# Patient Record
Sex: Female | Born: 1960
Health system: Southern US, Community
[De-identification: ages and names within clinical notes are randomized; demographics above are authoritative.]

## PROBLEM LIST (undated history)

## (undated) DIAGNOSIS — I10 Essential (primary) hypertension: Secondary | ICD-10-CM

## (undated) DIAGNOSIS — E7800 Pure hypercholesterolemia, unspecified: Secondary | ICD-10-CM

## (undated) HISTORY — DX: Pure hypercholesterolemia, unspecified: E78.00

## (undated) HISTORY — DX: Essential (primary) hypertension: I10

## (undated) HISTORY — PX: BREAST SURGERY: SHX581

---

## 2000-09-03 HISTORY — PX: AUGMENTATION MAMMAPLASTY: SUR837

## 2016-03-15 ENCOUNTER — Emergency Department (HOSPITAL_COMMUNITY): Payer: BLUE CROSS/BLUE SHIELD

## 2016-03-15 ENCOUNTER — Encounter (HOSPITAL_COMMUNITY): Payer: Self-pay | Admitting: Emergency Medicine

## 2016-03-15 ENCOUNTER — Emergency Department (HOSPITAL_COMMUNITY)
Admission: EM | Admit: 2016-03-15 | Discharge: 2016-03-16 | Disposition: A | Discharge status: Home / Self Care | Payer: BLUE CROSS/BLUE SHIELD | Attending: Emergency Medicine | Admitting: Emergency Medicine

## 2016-03-15 DIAGNOSIS — Z79899 Other long term (current) drug therapy: Secondary | ICD-10-CM | POA: Diagnosis not present

## 2016-03-15 DIAGNOSIS — N9489 Other specified conditions associated with female genital organs and menstrual cycle: Secondary | ICD-10-CM

## 2016-03-15 DIAGNOSIS — N739 Female pelvic inflammatory disease, unspecified: Secondary | ICD-10-CM | POA: Diagnosis not present

## 2016-03-15 DIAGNOSIS — N83201 Unspecified ovarian cyst, right side: Secondary | ICD-10-CM | POA: Diagnosis not present

## 2016-03-15 DIAGNOSIS — R935 Abnormal findings on diagnostic imaging of other abdominal regions, including retroperitoneum: Secondary | ICD-10-CM

## 2016-03-15 DIAGNOSIS — R1030 Lower abdominal pain, unspecified: Secondary | ICD-10-CM | POA: Diagnosis present

## 2016-03-15 LAB — COMPREHENSIVE METABOLIC PANEL
ALT: 24 U/L (ref 14–54)
AST: 25 U/L (ref 15–41)
Albumin: 4.3 g/dL (ref 3.5–5.0)
Alkaline Phosphatase: 88 U/L (ref 38–126)
Anion gap: 11 (ref 5–15)
BUN: 12 mg/dL (ref 6–20)
CO2: 20 mmol/L — ABNORMAL LOW (ref 22–32)
Calcium: 9.6 mg/dL (ref 8.9–10.3)
Chloride: 103 mmol/L (ref 101–111)
Creatinine, Ser: 0.85 mg/dL (ref 0.44–1.00)
GFR calc Af Amer: >60 mL/min (ref >60)
GFR calc non Af Amer: >60 mL/min (ref >60)
Glucose, Bld: 144 mg/dL — ABNORMAL HIGH (ref 65–99)
Potassium: 4.1 mmol/L (ref 3.5–5.1)
Sodium: 134 mmol/L — ABNORMAL LOW (ref 135–145)
Total Bilirubin: 1.1 mg/dL (ref 0.3–1.2)
Total Protein: 7.9 g/dL (ref 6.5–8.1)

## 2016-03-15 LAB — URINALYSIS, ROUTINE W REFLEX MICROSCOPIC
Bilirubin Urine: NEGATIVE
Glucose, UA: NEGATIVE mg/dL
Ketones, ur: NEGATIVE mg/dL
Nitrite: NEGATIVE
Protein, ur: NEGATIVE mg/dL
Specific Gravity, Urine: 1.019 (ref 1.005–1.030)
pH: 7.5 (ref 5.0–8.0)

## 2016-03-15 LAB — CBC
HCT: 39.5 % (ref 36.0–46.0)
Hemoglobin: 13.5 g/dL (ref 12.0–15.0)
MCH: 30.9 pg (ref 26.0–34.0)
MCHC: 34.2 g/dL (ref 30.0–36.0)
MCV: 90.4 fL (ref 78.0–100.0)
Platelets: 322 10*3/uL (ref 150–400)
RBC: 4.37 MIL/uL (ref 3.87–5.11)
RDW: 13.4 % (ref 11.5–15.5)
WBC: 16.6 10*3/uL — ABNORMAL HIGH (ref 4.0–10.5)

## 2016-03-15 LAB — URINE MICROSCOPIC-ADD ON

## 2016-03-15 LAB — LIPASE, BLOOD: Lipase: 17 U/L (ref 11–51)

## 2016-03-15 LAB — PREGNANCY, URINE: Preg Test, Ur: NEGATIVE

## 2016-03-15 MED ORDER — IOPAMIDOL (ISOVUE-300) INJECTION 61%
100.0000 mL | Freq: Once | INTRAVENOUS | Status: AC | PRN
Start: 2016-03-15 — End: 2016-03-15
  Administered 2016-03-15: 100 mL via INTRAVENOUS

## 2016-03-15 MED ORDER — SODIUM CHLORIDE 0.9 % IV BOLUS (SEPSIS)
1000.0000 mL | Freq: Once | INTRAVENOUS | Status: AC
  Administered 2016-03-15: 1000 mL via INTRAVENOUS

## 2016-03-15 MED ORDER — IOPAMIDOL (ISOVUE-300) INJECTION 61%: 100.0000 mL | Freq: Once | INTRAVENOUS | Status: DC | PRN

## 2016-03-15 MED ORDER — ONDANSETRON 4 MG PO TBDP
4.0000 mg | ORAL_TABLET | Freq: Once | ORAL | Status: AC | PRN
  Administered 2016-03-15: 4 mg via ORAL
  Filled 2016-03-15: qty 1

## 2016-03-15 MED ORDER — FENTANYL CITRATE (PF) 100 MCG/2ML IJ SOLN
50.0000 ug | Freq: Once | INTRAMUSCULAR | Status: AC
  Administered 2016-03-15: 50 ug via INTRAVENOUS
  Filled 2016-03-15: qty 2

## 2016-03-15 MED ORDER — MORPHINE SULFATE (PF) 2 MG/ML IV SOLN
2.0000 mg | Freq: Once | INTRAVENOUS | Status: AC
  Administered 2016-03-15: 2 mg via INTRAVENOUS
  Filled 2016-03-15: qty 1

## 2016-03-15 MED ORDER — MORPHINE SULFATE (PF) 4 MG/ML IV SOLN: 4.0000 mg | Freq: Once | INTRAVENOUS | Status: DC

## 2016-03-15 MED ORDER — ONDANSETRON HCL 4 MG/2ML IJ SOLN
4.0000 mg | Freq: Once | INTRAMUSCULAR | Status: AC
  Administered 2016-03-15: 4 mg via INTRAVENOUS
  Filled 2016-03-15: qty 2

## 2016-03-15 NOTE — ED Notes (Signed)
Patient presents for abdominal pain, nausea and fever at home of 101. Reports drinking mag citrate earlier with one soft BM. No urinary symptoms, emesis or diarrhea.

## 2016-03-15 NOTE — ED Provider Notes (Signed)
CSN: [id number]     Arrival date & time 03/15/16  1728 History   First MD Initiated Contact with Patient 03/15/16 2035     Chief Complaint  Patient presents with  . Abdominal Pain  . Diarrhea    HPI   Pat Patrickraci Minion is an 55 y.o. female with no significant PMH who presents to the ED for evaluation of abdominal pain, nausea, and diarrhea. She states her symptoms began this morning. She states she woke up with sharp, severe, diffuse abdominal cramping. She states her Tmax at home was 101F. She reports associated nausea but denies emesis. She states she was in [place] last week and had been taking immodium prophylactically all week so her stools have been hard. She thought she was constipated or "had a blockage" today so she drank a bottle of mag citrate at home this morning. She states now she has been having watery loose stools all throughout the day. She states people she went to [place] with are also having similar symptoms. Denies urinary symptoms or vaginal complaints. Denies chest pain or SOB.  History reviewed. No pertinent past medical history. Past Surgical History  Procedure Laterality Date  . Breast surgery     No family history on file. Social History  Substance Use Topics  . Smoking status: Never Smoker   . Smokeless tobacco: None  . Alcohol Use: Yes   OB History    No data available     Review of Systems  All other systems reviewed and are negative.     Allergies  Review of patient's allergies indicates no known allergies.  Home Medications   Prior to Admission medications   Medication Sig Start Date End Date Taking? Authorizing Provider  calcium carbonate (TUMS - DOSED IN MG ELEMENTAL CALCIUM) 500 MG chewable tablet Chew 1 tablet by mouth 2 (two) times daily with a meal.   Yes Historical Provider, MD  loperamide (IMODIUM A-D) 2 MG tablet Take 2 mg by mouth 4 (four) times daily as needed for diarrhea or loose stools.   Yes Historical Provider, MD  magnesium citrate  SOLN Take 1 Bottle by mouth once.   Yes Historical Provider, MD   BP 120/65 mmHg  Pulse 97  Temp(Src) 99.2 F (37.3 C) (Oral)  Resp 16  Ht 5\' 4"  (1.626 m)  Wt 81.647 kg  BMI 30.88 kg/m2  SpO2 96%  LMP 03/03/2016 Physical Exam  Constitutional: She is oriented to person, place, and time.  HENT:  Right Ear: External ear normal.  Left Ear: External ear normal.  Nose: Nose normal.  Mouth/Throat: Oropharynx is clear and moist. No oropharyngeal exudate.  MM dry  Eyes: Conjunctivae and EOM are normal. Pupils are equal, round, and reactive to light.  Neck: Normal range of motion. Neck supple.  Cardiovascular: Normal rate, regular rhythm, normal heart sounds and intact distal pulses.   Pulmonary/Chest: Effort normal and breath sounds normal. No respiratory distress. She has no wheezes.  Abdominal: Soft. Bowel sounds are normal. She exhibits no distension. There is tenderness. There is no rebound and no guarding.  Abdomen diffusely tender without rebound or guarding. Pain is worse with coughing. No CVA tenderness  Musculoskeletal: She exhibits no edema.  Neurological: She is alert and oriented to person, place, and time. No cranial nerve deficit.  Skin: Skin is warm and dry.  Psychiatric: She has a normal mood and affect.  Nursing note and vitals reviewed.   ED Course  Procedures (including critical care time) Labs Review  Labs Reviewed  COMPREHENSIVE METABOLIC PANEL - Abnormal; Notable for the following:    Sodium 134 (*)    CO2 20 (*)    Glucose, Bld 144 (*)    All other components within normal limits  CBC - Abnormal; Notable for the following:    WBC 16.6 (*)    All other components within normal limits  URINALYSIS, ROUTINE W REFLEX MICROSCOPIC (NOT AT [facility]) - Abnormal; Notable for the following:    APPearance CLOUDY (*)    Hgb urine dipstick TRACE (*)    Leukocytes, UA LARGE (*)    All other components within normal limits  URINE MICROSCOPIC-ADD ON - Abnormal; Notable for  the following:    Squamous Epithelial / LPF 6-30 (*)    Bacteria, UA FEW (*)    All other components within normal limits  LIPASE, BLOOD  PREGNANCY, URINE    Imaging Review US Transvaginal Non-ob  03/16/2016  CLINICAL DATA:  55 year old female with complex right adnexal cyst and hydrosalpinx seen on the earlier CT. EXAM: TRANSABDOMINAL AND TRANSVAGINAL ULTRASOUND OF PELVIS TECHNIQUE: Both transabdominal and transvaginal ultrasound examinations of the pelvis were performed. Transabdominal technique was performed for global imaging of the pelvis including uterus, ovaries, adnexal regions, and pelvic cul-de-sac. It was necessary to proceed with endovaginal exam following the transabdominal exam to visualize the endometrium and the ovaries. COMPARISON:  Abdominal CT dated 03/15/2016 FINDINGS: Uterus The uterus is anteverted and somewhat heterogeneous. A measures 7.0 x 3.9 x 4.3 cm. An intrauterine device appears somewhat positioned lower in the uterus. There is a 4 mm calcific focus, likely a fibroid. Endometrium Thickness: 6 mm.  No focal abnormality visualized. Right ovary Measurements: 5.8 x 4.2 x 4.6 cm. There multiple somewhat complex cyst in the right ovary measuring up to 3.0 x 2.0 x 2.2 cm. This may represent proteinaceous or hemorrhagic cysts and corpus luteum. An infectious process is not excluded. Clinical correlation is recommended. There is slight increased vascularity to the right ovary. Left ovary Not visualized Other findings No significant free fluid within the pelvis. A fluid-filled loop of bowel noted in the posterior pelvis. IMPRESSION: Multiple complex appearing right ovarian cysts. Follow-up with ultrasound in 6-8 weeks recommended. Intrauterine device in the lower uterus. Correlation with clinical exam recommended. Electronically Signed   By: Elgie Collard M.D.   On: 03/16/2016 01:14   US Pelvis Complete  03/16/2016  CLINICAL DATA:  55 year old female with complex right adnexal  cyst and hydrosalpinx seen on the earlier CT. EXAM: TRANSABDOMINAL AND TRANSVAGINAL ULTRASOUND OF PELVIS TECHNIQUE: Both transabdominal and transvaginal ultrasound examinations of the pelvis were performed. Transabdominal technique was performed for global imaging of the pelvis including uterus, ovaries, adnexal regions, and pelvic cul-de-sac. It was necessary to proceed with endovaginal exam following the transabdominal exam to visualize the endometrium and the ovaries. COMPARISON:  Abdominal CT dated 03/15/2016 FINDINGS: Uterus The uterus is anteverted and somewhat heterogeneous. A measures 7.0 x 3.9 x 4.3 cm. An intrauterine device appears somewhat positioned lower in the uterus. There is a 4 mm calcific focus, likely a fibroid. Endometrium Thickness: 6 mm.  No focal abnormality visualized. Right ovary Measurements: 5.8 x 4.2 x 4.6 cm. There multiple somewhat complex cyst in the right ovary measuring up to 3.0 x 2.0 x 2.2 cm. This may represent proteinaceous or hemorrhagic cysts and corpus luteum. An infectious process is not excluded. Clinical correlation is recommended. There is slight increased vascularity to the right ovary. Left ovary Not visualized Other findings  No significant free fluid within the pelvis. A fluid-filled loop of bowel noted in the posterior pelvis. IMPRESSION: Multiple complex appearing right ovarian cysts. Follow-up with ultrasound in 6-8 weeks recommended. Intrauterine device in the lower uterus. Correlation with clinical exam recommended. Electronically Signed   By: Elgie Collard M.D.   On: 03/16/2016 01:14   Ct Abdomen Pelvis W Contrast  03/15/2016  CLINICAL DATA:  Abdominal pain, nausea, and fever. EXAM: CT ABDOMEN AND PELVIS WITH CONTRAST TECHNIQUE: Multidetector CT imaging of the abdomen and pelvis was performed using the standard protocol following bolus administration of intravenous contrast. CONTRAST:  ISOVUE-300 IOPAMIDOL (ISOVUE-300) INJECTION 61% COMPARISON:  None.  FINDINGS: Atelectasis in the lung bases. Nodular scarring in the left anterior lung base. Bilateral breast implants. The liver, spleen, gallbladder, pancreas, adrenal glands, kidneys, abdominal aorta, inferior vena cava, and retroperitoneal lymph nodes are unremarkable. Stomach, small bowel, and colon are not abnormally distended. Colon is fluid-filled with multiple air-fluid levels consistent with diarrhea. No colonic wall thickening is appreciated. No free air or free fluid in the abdomen. Pelvis: The appendix is normal. Complex cystic structure with tubular fluid-filled structure in the right ovary and adnexa. This probably represents ovarian cyst with hydrosalpinx. Infection is not excluded. Left ovary is not enlarged. Intrauterine device is present. No free pelvic fluid collections. No pelvic lymphadenopathy. No destructive bone lesions. IMPRESSION: Fluid throughout the colon consistent with diarrhea. No bowel obstruction or inflammation. Complex right ovarian cyst and hydrosalpinx. Tubo-ovarian abscess not excluded. Electronically Signed   By: Burman Nieves M.D.   On: 03/15/2016 23:23   I have personally reviewed and evaluated these images and lab results as part of my medical decision-making.   EKG Interpretation None      MDM   Final diagnoses:  Lower abdominal pain  PID (pelvic inflammatory disease)  Right ovarian cyst    Pt diffusely tender on exam though no guarding. Pain is worse with movement and coughing. Pain is improved after fentanyl. Labs reveal a white count of 16.6 with a mild hyponatremia of 132 and Co2 20. Fluids repleted in the ED. CT reveals fluid in colon consistent with diarrhea but no obstruction or inflammation. There is also a complex right ovarian cyst and hydrosalpinx that is possibly TOA. Will do pelvic exam to assess adnexal tenderness and obtain pelvic US.   Pt declining speculum exam now due to return of bilateral lower abdominal pain. She continues to deny  vaginal complaints but now reports she has a copper IUD that has been in place for over 20 years; she states she was supposed to have it removed seven years ago. She states she has not had sexual intercourse in over a year. I did a bimanual exam and she does have cervical motion tenderness with palpable IUD strings. No adnexal tenderness. Will visually inspect with speculum once pain is better controlled. Pelvic US is pending.   I spoke to Dr. Debroah Loop of OB/GYN in anticipation of needing to sign out to oncoming team at end of shift. Appreciate assistance. If there is a TOA we will admit to medicine for parenteral abx. If no abscess treat as PID with close outpatient follow up.   US reveals multiple complex right ovarian cysts with recommendation for f/u US in 6-8 weeks. Pt continues to feel improved with minimal pain and is tolerating PO. Repeat abdominal exam benign. Has been given initial dose of abx for PID. Will d/c home with rx for doxy and flagyl. Rx given for  zofran and pain meds as needed. SHe has OBGYN f/u. Strict ER return precautions given.  Carlene Coria, PA-C 03/16/16 1200  Mancel Bale, MD 03/16/16 1218

## 2016-03-16 MED ORDER — HYDROMORPHONE HCL 1 MG/ML IJ SOLN
1.0000 mg | Freq: Once | INTRAMUSCULAR | Status: AC
  Administered 2016-03-16: 1 mg via INTRAVENOUS
  Filled 2016-03-16 (×2): qty 1

## 2016-03-16 MED ORDER — METRONIDAZOLE 500 MG PO TABS: 500.0000 mg | ORAL_TABLET | Freq: Two times a day (BID) | ORAL | Status: DC

## 2016-03-16 MED ORDER — DEXTROSE 5 % IV SOLN
1.0000 g | Freq: Once | INTRAVENOUS | Status: AC
  Administered 2016-03-16: 1 g via INTRAVENOUS
  Filled 2016-03-16: qty 10

## 2016-03-16 MED ORDER — DOXYCYCLINE HYCLATE 100 MG PO TABS
100.0000 mg | ORAL_TABLET | Freq: Once | ORAL | Status: AC
  Administered 2016-03-16: 100 mg via ORAL
  Filled 2016-03-16: qty 1

## 2016-03-16 MED ORDER — OXYCODONE-ACETAMINOPHEN 5-325 MG PO TABS
1.0000 | ORAL_TABLET | Freq: Once | ORAL | Status: AC
  Administered 2016-03-16: 1 via ORAL
  Filled 2016-03-16: qty 1

## 2016-03-16 MED ORDER — DOXYCYCLINE HYCLATE 100 MG PO CAPS: 100.0000 mg | ORAL_CAPSULE | Freq: Two times a day (BID) | ORAL | Status: DC

## 2016-03-16 MED ORDER — ONDANSETRON 4 MG PO TBDP: 4.0000 mg | ORAL_TABLET | Freq: Three times a day (TID) | ORAL | Status: DC | PRN

## 2016-03-16 MED ORDER — OXYCODONE-ACETAMINOPHEN 5-325 MG PO TABS: 1.0000 | ORAL_TABLET | Freq: Four times a day (QID) | ORAL | Status: DC | PRN

## 2016-03-16 NOTE — Discharge Instructions (Signed)
You were seen in the emergency room today for evaluation of abdominal pain. Your CT scan showed some diarrhea but no inflammation or obstruction. You do have several right ovarian cysts on your CT scan and ultrasound. The radiologist recommends a repeat ultrasound in 6-8 weeks. In the meantime I will treat you for pelvic inflammatory disease with antibiotics for two weeks. Please follow up with your OB/GYN as soon as possible. Your IUD is likely what is causing the infection. Take medication as prescribed as needed for pain and nausea. Return to the ER for new or worsening symptoms.   Pelvic Inflammatory Disease Pelvic inflammatory disease (PID) refers to an infection in some or all of the female organs. The infection can be in the uterus, ovaries, fallopian tubes, or the surrounding tissues in the pelvis. PID can cause abdominal or pelvic pain that comes on suddenly (acute pelvic pain). PID is a serious infection because it can lead to lasting (chronic) pelvic pain or the inability to have children (infertility). CAUSES This condition is most often caused by an infection that is spread during sexual contact. However, the infection can also be caused by the normal bacteria that are found in the vaginal tissues if these bacteria travel upward into the reproductive organs. PID can also occur following:  The birth of a baby.  A miscarriage.  An abortion.  Major pelvic surgery.  The use of an intrauterine device (IUD).  A sexual assault. RISK FACTORS This condition is more likely to develop in women who:  Are younger than 53 years of age.  Are sexually active at [facility] age.  Use nonbarrier contraception.  Have multiple sexual partners.  Have sex with someone who has symptoms of an STD (sexually transmitted disease).  Use oral contraception. At times, certain behaviors can also increase the possibility of getting PID, such as:  Using a vaginal douche.  Having an IUD in  place. SYMPTOMS Symptoms of this condition include:  Abdominal or pelvic pain.  Fever.  Chills.  Abnormal vaginal discharge.  Abnormal uterine bleeding.  Unusual pain shortly after the end of a menstrual period.  Painful urination.  Pain with sexual intercourse.  Nausea and vomiting. DIAGNOSIS To diagnose this condition, your health care provider will do a physical exam and take your medical history. A pelvic exam typically reveals great tenderness in the uterus and the surrounding pelvic tissues. You may also have tests, such as:  Lab tests, including a pregnancy test, blood tests, and urine test.  Culture tests of the vagina and cervix to check for an STD.  Ultrasound.  A laparoscopic procedure to look inside the pelvis.  Examining vaginal secretions under a microscope. TREATMENT Treatment for this condition may involve one or more approaches.  Antibiotic medicines may be prescribed to be taken by mouth.  Sexual partners may need to be treated if the infection is caused by an STD.  For more severe cases, hospitalization may be needed to give antibiotics directly into a vein through an IV tube.  Surgery may be needed if other treatments do not help, but this is rare. It may take weeks until you are completely well. If you are diagnosed with PID, you should also be checked for human immunodeficiency virus (HIV). Your health care provider may test you for infection again 3 months after treatment. You should not have unprotected sex. HOME CARE INSTRUCTIONS  Take over-the-counter and prescription medicines only as told by your health care provider.  If you were prescribed an  antibiotic medicine, take it as told by your health care provider. Do not stop taking the antibiotic even if you start to feel better.  Do not have sexual intercourse until treatment is completed or as told by your health care provider. If PID is confirmed, your recent sexual partners will need  treatment, especially if you had unprotected sex.  Keep all follow-up visits as told by your health care provider. This is important. SEEK MEDICAL CARE IF:  You have increased or abnormal vaginal discharge.  Your pain does not improve.  You vomit.  You have a fever.  You cannot tolerate your medicines.  Your partner has an STD.  You have pain when you urinate. SEEK IMMEDIATE MEDICAL CARE IF:  You have increased abdominal or pelvic pain.  You have chills.  Your symptoms are not better in 72 hours even with treatment.   This information is not intended to replace advice given to you by your health care provider. Make sure you discuss any questions you have with your health care provider.   Document Released: 08/20/2005 Document Revised: 05/11/2015 Document Reviewed: 09/27/2014 Elsevier Interactive Patient Education [employer]2016 [employer]

## 2016-03-18 ENCOUNTER — Encounter (HOSPITAL_COMMUNITY): Payer: Self-pay

## 2016-03-18 ENCOUNTER — Emergency Department (HOSPITAL_COMMUNITY)
Admission: EM | Admit: 2016-03-18 | Discharge: 2016-03-18 | Disposition: A | Discharge status: Home / Self Care | Payer: BLUE CROSS/BLUE SHIELD | Attending: Dermatology | Admitting: Dermatology

## 2016-03-18 ENCOUNTER — Telehealth (HOSPITAL_BASED_OUTPATIENT_CLINIC_OR_DEPARTMENT_OTHER): Payer: Self-pay

## 2016-03-18 DIAGNOSIS — Z5321 Procedure and treatment not carried out due to patient leaving prior to being seen by health care provider: Secondary | ICD-10-CM | POA: Diagnosis not present

## 2016-03-18 DIAGNOSIS — N939 Abnormal uterine and vaginal bleeding, unspecified: Secondary | ICD-10-CM | POA: Insufficient documentation

## 2016-03-18 DIAGNOSIS — R1030 Lower abdominal pain, unspecified: Secondary | ICD-10-CM | POA: Diagnosis not present

## 2016-03-18 LAB — BASIC METABOLIC PANEL
Anion gap: 7 (ref 5–15)
BUN: 7 mg/dL (ref 6–20)
CO2: 23 mmol/L (ref 22–32)
Calcium: 9.1 mg/dL (ref 8.9–10.3)
Chloride: 106 mmol/L (ref 101–111)
Creatinine, Ser: 0.7 mg/dL (ref 0.44–1.00)
GFR calc Af Amer: >60 mL/min (ref >60)
GFR calc non Af Amer: >60 mL/min (ref >60)
Glucose, Bld: 115 mg/dL — ABNORMAL HIGH (ref 65–99)
Potassium: 3.8 mmol/L (ref 3.5–5.1)
Sodium: 136 mmol/L (ref 135–145)

## 2016-03-18 LAB — CBC
HCT: 37 % (ref 36.0–46.0)
Hemoglobin: 12.1 g/dL (ref 12.0–15.0)
MCH: 30.3 pg (ref 26.0–34.0)
MCHC: 32.7 g/dL (ref 30.0–36.0)
MCV: 92.7 fL (ref 78.0–100.0)
Platelets: 356 10*3/uL (ref 150–400)
RBC: 3.99 MIL/uL (ref 3.87–5.11)
RDW: 13.3 % (ref 11.5–15.5)
WBC: 15.3 10*3/uL — ABNORMAL HIGH (ref 4.0–10.5)

## 2016-03-18 NOTE — ED Notes (Signed)
Called for vital sign recheck x2. No answer.

## 2016-03-18 NOTE — ED Notes (Signed)
No answer for vital sign recheck. 

## 2016-03-18 NOTE — ED Notes (Signed)
Patient here with lower abdominal pain and developed vaginal bleeding with clots since [facility].  Diagnosed with PID and currently taking doxy and flagyl as prescribed. Here for further evaluation

## 2017-11-21 MED FILL — PRAVASTATIN NA 40 MG TAB: 40 | 90 days supply | Qty: 90 | Fill #0

## 2018-02-20 MED FILL — PRAVASTATIN NA 40 MG TAB: 40 | 30 days supply | Qty: 30 | Fill #1

## 2018-02-26 DIAGNOSIS — F331 Major depressive disorder, recurrent, moderate: Secondary | ICD-10-CM | POA: Diagnosis not present

## 2018-03-15 DIAGNOSIS — Z0000 Encounter for general adult medical examination without abnormal findings: Secondary | ICD-10-CM | POA: Diagnosis not present

## 2018-03-19 DIAGNOSIS — Z23 Encounter for immunization: Secondary | ICD-10-CM | POA: Diagnosis not present

## 2018-03-19 DIAGNOSIS — N6310 Unspecified lump in the right breast, unspecified quadrant: Secondary | ICD-10-CM | POA: Diagnosis not present

## 2018-03-19 DIAGNOSIS — Z136 Encounter for screening for cardiovascular disorders: Secondary | ICD-10-CM | POA: Diagnosis not present

## 2018-03-19 DIAGNOSIS — Z79899 Other long term (current) drug therapy: Secondary | ICD-10-CM | POA: Diagnosis not present

## 2018-03-19 DIAGNOSIS — R55 Syncope and collapse: Secondary | ICD-10-CM | POA: Diagnosis not present

## 2018-03-19 DIAGNOSIS — Z1231 Encounter for screening mammogram for malignant neoplasm of breast: Secondary | ICD-10-CM | POA: Diagnosis not present

## 2018-03-19 DIAGNOSIS — E782 Mixed hyperlipidemia: Secondary | ICD-10-CM | POA: Diagnosis not present

## 2018-03-19 DIAGNOSIS — Z1211 Encounter for screening for malignant neoplasm of colon: Secondary | ICD-10-CM | POA: Diagnosis not present

## 2018-03-19 DIAGNOSIS — F419 Anxiety disorder, unspecified: Secondary | ICD-10-CM | POA: Diagnosis not present

## 2018-03-19 DIAGNOSIS — Z0000 Encounter for general adult medical examination without abnormal findings: Secondary | ICD-10-CM | POA: Diagnosis not present

## 2018-03-19 DIAGNOSIS — F331 Major depressive disorder, recurrent, moderate: Secondary | ICD-10-CM | POA: Diagnosis not present

## 2018-03-19 MED FILL — PRAVASTATIN NA 40 MG TAB: 40 | 90 days supply | Qty: 90 | Fill #0

## 2018-03-21 MED FILL — ALPRAZolam 0.5 MG TABS: 0.5 | 30 days supply | Qty: 30 | Fill #0

## 2018-03-24 DIAGNOSIS — F331 Major depressive disorder, recurrent, moderate: Secondary | ICD-10-CM | POA: Diagnosis not present

## 2018-04-02 DIAGNOSIS — F331 Major depressive disorder, recurrent, moderate: Secondary | ICD-10-CM | POA: Diagnosis not present

## 2018-04-03 DIAGNOSIS — M79644 Pain in right finger(s): Secondary | ICD-10-CM | POA: Diagnosis not present

## 2018-04-03 DIAGNOSIS — M2242 Chondromalacia patellae, left knee: Secondary | ICD-10-CM | POA: Diagnosis not present

## 2018-04-10 DIAGNOSIS — F331 Major depressive disorder, recurrent, moderate: Secondary | ICD-10-CM | POA: Diagnosis not present

## 2018-04-17 DIAGNOSIS — F331 Major depressive disorder, recurrent, moderate: Secondary | ICD-10-CM | POA: Diagnosis not present

## 2018-04-24 DIAGNOSIS — F331 Major depressive disorder, recurrent, moderate: Secondary | ICD-10-CM | POA: Diagnosis not present

## 2018-05-08 DIAGNOSIS — F331 Major depressive disorder, recurrent, moderate: Secondary | ICD-10-CM | POA: Diagnosis not present

## 2018-05-20 MED FILL — ALPRAZolam 0.5 MG TABS: 0.5 | 30 days supply | Qty: 30 | Fill #1

## 2018-05-22 DIAGNOSIS — F331 Major depressive disorder, recurrent, moderate: Secondary | ICD-10-CM | POA: Diagnosis not present

## 2018-06-09 DIAGNOSIS — F331 Major depressive disorder, recurrent, moderate: Secondary | ICD-10-CM | POA: Diagnosis not present

## 2018-06-20 MED FILL — ALPRAZolam 0.5 MG TABS: 0.5 | 30 days supply | Qty: 30 | Fill #2

## 2018-06-23 DIAGNOSIS — F331 Major depressive disorder, recurrent, moderate: Secondary | ICD-10-CM | POA: Diagnosis not present

## 2018-06-23 MED FILL — PRAVASTATIN NA 40 MG TAB: 40 | 90 days supply | Qty: 90 | Fill #1

## 2018-07-03 DIAGNOSIS — F331 Major depressive disorder, recurrent, moderate: Secondary | ICD-10-CM | POA: Diagnosis not present

## 2018-07-22 MED FILL — ALPRAZolam 0.5 MG TABS: 0.5 | 30 days supply | Qty: 30 | Fill #3

## 2018-08-20 DIAGNOSIS — R928 Other abnormal and inconclusive findings on diagnostic imaging of breast: Secondary | ICD-10-CM | POA: Diagnosis not present

## 2018-08-20 DIAGNOSIS — N6310 Unspecified lump in the right breast, unspecified quadrant: Secondary | ICD-10-CM | POA: Diagnosis not present

## 2018-08-20 DIAGNOSIS — N6081 Other benign mammary dysplasias of right breast: Secondary | ICD-10-CM | POA: Diagnosis not present

## 2018-08-21 MED FILL — ALPRAZolam 0.5 MG TABS: 0.5 | 30 days supply | Qty: 30 | Fill #4

## 2018-08-28 DIAGNOSIS — H5213 Myopia, bilateral: Secondary | ICD-10-CM | POA: Diagnosis not present

## 2018-10-13 MED FILL — PRAVASTATIN NA 40 MG TAB: 40 | 90 days supply | Qty: 90 | Fill #2

## 2019-02-19 MED FILL — PRAVASTATIN NA 40 MG TAB: 40 | 90 days supply | Qty: 90 | Fill #3

## 2019-10-06 ENCOUNTER — Other Ambulatory Visit: Payer: Self-pay | Admitting: Family Medicine

## 2019-10-06 DIAGNOSIS — Z1231 Encounter for screening mammogram for malignant neoplasm of breast: Secondary | ICD-10-CM

## 2019-11-12 ENCOUNTER — Other Ambulatory Visit: Payer: Self-pay

## 2019-11-12 ENCOUNTER — Ambulatory Visit
Admission: RE | Admit: 2019-11-12 | Discharge: 2019-11-12 | Disposition: A | Discharge status: Home / Self Care | Payer: No Typology Code available for payment source | Source: Ambulatory Visit | Attending: Family Medicine | Admitting: Family Medicine

## 2019-11-12 DIAGNOSIS — Z1231 Encounter for screening mammogram for malignant neoplasm of breast: Secondary | ICD-10-CM

## 2020-01-26 MED FILL — ROSUVASTATIN CALCIUM 20 MG: 20 | 90 days supply | Qty: 90 | Fill #0

## 2020-01-26 MED FILL — PARoxetine HCL 10 MG TABS: 10 | 90 days supply | Qty: 90 | Fill #0

## 2020-04-25 MED FILL — ROSUVASTATIN CALCIUM 20 MG: 20 | 90 days supply | Qty: 90 | Fill #1

## 2020-04-27 MED FILL — PARoxetine HCL 10 MG TABS: 10 | 90 days supply | Qty: 90 | Fill #1

## 2020-05-19 ENCOUNTER — Other Ambulatory Visit (HOSPITAL_COMMUNITY): Payer: Self-pay | Admitting: Internal Medicine

## 2020-05-19 MED FILL — ALPRAZolam 0.5 MG TABS: 0.5 | 20 days supply | Qty: 20 | Fill #0

## 2020-05-19 MED FILL — PARoxetine HCL 20 MG TABS: 20 | 90 days supply | Qty: 90 | Fill #0

## 2020-07-25 ENCOUNTER — Other Ambulatory Visit (HOSPITAL_COMMUNITY): Payer: Self-pay | Admitting: Internal Medicine

## 2020-07-25 MED FILL — ROSUVASTATIN CALCIUM 20 MG: 20 | 90 days supply | Qty: 90 | Fill #0

## 2020-09-27 ENCOUNTER — Other Ambulatory Visit (HOSPITAL_COMMUNITY): Payer: Self-pay | Admitting: Family Medicine

## 2020-09-27 LAB — CBC AND DIFFERENTIAL
HCT: 44 (ref 36–46)
Hemoglobin: 14.6 (ref 12.0–16.0)
Neutrophils Absolute: 39.4
Platelets: 340 (ref 150–399)
WBC: 9.4

## 2020-09-27 LAB — CBC: RBC: 4.8 (ref 3.87–5.11)

## 2020-09-27 MED FILL — ONDANSETRON ODT 8 MG TABLET: 8 | 30 days supply | Qty: 60 | Fill #0

## 2020-10-11 ENCOUNTER — Other Ambulatory Visit (HOSPITAL_COMMUNITY): Payer: Self-pay | Admitting: Internal Medicine

## 2020-10-11 ENCOUNTER — Other Ambulatory Visit: Payer: Self-pay | Admitting: Internal Medicine

## 2020-10-11 DIAGNOSIS — R42 Dizziness and giddiness: Secondary | ICD-10-CM

## 2020-10-12 ENCOUNTER — Other Ambulatory Visit: Payer: Self-pay | Admitting: Internal Medicine

## 2020-10-12 DIAGNOSIS — Z1231 Encounter for screening mammogram for malignant neoplasm of breast: Secondary | ICD-10-CM

## 2020-10-14 ENCOUNTER — Other Ambulatory Visit: Payer: Self-pay

## 2020-10-14 ENCOUNTER — Ambulatory Visit (HOSPITAL_COMMUNITY)
Admission: RE | Admit: 2020-10-14 | Discharge: 2020-10-14 | Disposition: A | Discharge status: Home / Self Care | Payer: No Typology Code available for payment source | Source: Ambulatory Visit | Attending: Internal Medicine | Admitting: Internal Medicine

## 2020-10-14 DIAGNOSIS — R42 Dizziness and giddiness: Secondary | ICD-10-CM | POA: Diagnosis not present

## 2020-10-14 MED ORDER — GADOBUTROL 1 MMOL/ML IV SOLN
8.0000 mL | Freq: Once | INTRAVENOUS | Status: AC | PRN
  Administered 2020-10-14: 8 mL via INTRAVENOUS

## 2020-10-24 ENCOUNTER — Other Ambulatory Visit (HOSPITAL_COMMUNITY): Payer: Self-pay | Admitting: Family Medicine

## 2020-10-31 MED FILL — ROSUVASTATIN CALCIUM 20 MG: 20 | 90 days supply | Qty: 90 | Fill #1

## 2020-11-23 MED FILL — PARoxetine HCL 20 MG TABS: 20 | 90 days supply | Qty: 90 | Fill #1

## 2020-12-02 ENCOUNTER — Ambulatory Visit: Payer: No Typology Code available for payment source

## 2020-12-02 ENCOUNTER — Inpatient Hospital Stay: Admission: RE | Admit: 2020-12-02 | Payer: No Typology Code available for payment source | Source: Ambulatory Visit

## 2020-12-22 ENCOUNTER — Ambulatory Visit: Payer: No Typology Code available for payment source | Admitting: Neurology

## 2020-12-23 ENCOUNTER — Other Ambulatory Visit (HOSPITAL_COMMUNITY): Payer: Self-pay

## 2020-12-23 MED ORDER — AMOXICILLIN 500 MG PO CAPS
500.0000 mg | ORAL_CAPSULE | Freq: Three times a day (TID) | ORAL | 0 refills | Status: DC
  Filled 2020-12-23: qty 21, 7d supply, fill #0

## 2021-01-13 LAB — COMPREHENSIVE METABOLIC PANEL
Albumin: 3.9 (ref 3.5–5.0)
Calcium: 9.1 (ref 8.7–10.7)
GFR calc Af Amer: 99.6
GFR calc non Af Amer: 99.6

## 2021-01-13 LAB — BASIC METABOLIC PANEL
BUN: 11 (ref 4–21)
CO2: 24 — AB (ref 13–22)
Chloride: 106 (ref 99–108)
Creatinine: 0.8 (ref 0.5–1.1)
Glucose: 103
Potassium: 4.8 (ref 3.4–5.3)
Sodium: 141 (ref 137–147)

## 2021-01-13 LAB — LIPID PANEL
Cholesterol: 192 (ref 0–200)
HDL: 52 (ref 35–70)
LDL Cholesterol: 100
LDl/HDL Ratio: 1.9
Triglycerides: 199 — AB (ref 40–160)

## 2021-01-13 LAB — CBC: RBC: 4.5 (ref 3.87–5.11)

## 2021-01-13 LAB — CBC AND DIFFERENTIAL
HCT: 43 (ref 36–46)
Hemoglobin: 13.7 (ref 12.0–16.0)
Neutrophils Absolute: 3.4
Platelets: 327 (ref 150–399)
WBC: 7.2

## 2021-01-13 LAB — HEPATIC FUNCTION PANEL
ALT: 57 — AB (ref 7–35)
AST: 31 (ref 13–35)
Alkaline Phosphatase: 101 (ref 25–125)
Bilirubin, Total: 0.5

## 2021-01-13 LAB — TSH: TSH: 0.74 (ref 0.41–5.90)

## 2021-01-19 ENCOUNTER — Other Ambulatory Visit (HOSPITAL_COMMUNITY): Payer: Self-pay

## 2021-01-19 MED ORDER — ALPRAZOLAM 0.5 MG PO TABS
0.2500 mg | ORAL_TABLET | Freq: Every day | ORAL | 2 refills | Status: DC | PRN
  Filled 2021-01-19: qty 20, 20d supply, fill #0
  Filled 2021-03-20 – 2021-03-29 (×2): qty 20, 20d supply, fill #1
  Filled 2021-07-06: qty 20, 20d supply, fill #2

## 2021-01-23 ENCOUNTER — Other Ambulatory Visit: Payer: Self-pay

## 2021-01-23 ENCOUNTER — Ambulatory Visit
Admission: RE | Admit: 2021-01-23 | Discharge: 2021-01-23 | Disposition: A | Discharge status: Home / Self Care | Payer: No Typology Code available for payment source | Source: Ambulatory Visit | Attending: Internal Medicine | Admitting: Internal Medicine

## 2021-01-23 DIAGNOSIS — Z1231 Encounter for screening mammogram for malignant neoplasm of breast: Secondary | ICD-10-CM

## 2021-01-24 ENCOUNTER — Other Ambulatory Visit (HOSPITAL_COMMUNITY): Payer: Self-pay

## 2021-02-07 ENCOUNTER — Other Ambulatory Visit (HOSPITAL_COMMUNITY): Payer: Self-pay

## 2021-02-07 MED FILL — Rosuvastatin Calcium Tab 20 MG: ORAL | 90 days supply | Qty: 90 | Fill #0 | Status: AC

## 2021-03-20 ENCOUNTER — Other Ambulatory Visit (HOSPITAL_COMMUNITY): Payer: Self-pay

## 2021-03-28 ENCOUNTER — Other Ambulatory Visit (HOSPITAL_COMMUNITY): Payer: Self-pay

## 2021-03-29 ENCOUNTER — Other Ambulatory Visit (HOSPITAL_COMMUNITY): Payer: Self-pay

## 2021-04-04 ENCOUNTER — Other Ambulatory Visit (INDEPENDENT_AMBULATORY_CARE_PROVIDER_SITE_OTHER): Payer: Self-pay

## 2021-04-04 ENCOUNTER — Ambulatory Visit (INDEPENDENT_AMBULATORY_CARE_PROVIDER_SITE_OTHER): Payer: No Typology Code available for payment source | Admitting: Family Medicine

## 2021-04-04 ENCOUNTER — Encounter (INDEPENDENT_AMBULATORY_CARE_PROVIDER_SITE_OTHER): Payer: Self-pay | Admitting: Family Medicine

## 2021-04-04 ENCOUNTER — Other Ambulatory Visit: Payer: Self-pay

## 2021-04-04 VITALS — BP 109/69 | HR 86 | Temp 98.3°F | Ht 64.0 in | Wt 198.0 lb

## 2021-04-04 DIAGNOSIS — Z9189 Other specified personal risk factors, not elsewhere classified: Secondary | ICD-10-CM | POA: Diagnosis not present

## 2021-04-04 DIAGNOSIS — I10 Essential (primary) hypertension: Secondary | ICD-10-CM | POA: Diagnosis not present

## 2021-04-04 DIAGNOSIS — F32A Depression, unspecified: Secondary | ICD-10-CM | POA: Diagnosis not present

## 2021-04-04 DIAGNOSIS — Z0289 Encounter for other administrative examinations: Secondary | ICD-10-CM

## 2021-04-04 DIAGNOSIS — R5383 Other fatigue: Secondary | ICD-10-CM

## 2021-04-04 DIAGNOSIS — E782 Mixed hyperlipidemia: Secondary | ICD-10-CM | POA: Diagnosis not present

## 2021-04-04 DIAGNOSIS — F419 Anxiety disorder, unspecified: Secondary | ICD-10-CM

## 2021-04-04 DIAGNOSIS — R0602 Shortness of breath: Secondary | ICD-10-CM

## 2021-04-04 DIAGNOSIS — E669 Obesity, unspecified: Secondary | ICD-10-CM

## 2021-04-04 DIAGNOSIS — R7989 Other specified abnormal findings of blood chemistry: Secondary | ICD-10-CM

## 2021-04-04 DIAGNOSIS — Z6834 Body mass index (BMI) 34.0-34.9, adult: Secondary | ICD-10-CM

## 2021-04-05 ENCOUNTER — Encounter (INDEPENDENT_AMBULATORY_CARE_PROVIDER_SITE_OTHER): Payer: Self-pay | Admitting: Family Medicine

## 2021-04-05 LAB — VITAMIN B12: Vitamin B-12: 1073 pg/mL (ref 232–1245)

## 2021-04-05 LAB — COMPREHENSIVE METABOLIC PANEL
ALT: 35 IU/L — ABNORMAL HIGH (ref 0–32)
AST: 23 IU/L (ref 0–40)
Albumin/Globulin Ratio: 2.3 — ABNORMAL HIGH (ref 1.2–2.2)
Albumin: 4.8 g/dL (ref 3.8–4.9)
Alkaline Phosphatase: 102 IU/L (ref 44–121)
BUN/Creatinine Ratio: 20 (ref 9–23)
BUN: 15 mg/dL (ref 6–24)
Bilirubin Total: 0.5 mg/dL (ref 0.0–1.2)
CO2: 25 mmol/L (ref 20–29)
Calcium: 10 mg/dL (ref 8.7–10.2)
Chloride: 102 mmol/L (ref 96–106)
Creatinine, Ser: 0.74 mg/dL (ref 0.57–1.00)
Globulin, Total: 2.1 g/dL (ref 1.5–4.5)
Glucose: 103 mg/dL — ABNORMAL HIGH (ref 65–99)
Potassium: 4.8 mmol/L (ref 3.5–5.2)
Sodium: 141 mmol/L (ref 134–144)
Total Protein: 6.9 g/dL (ref 6.0–8.5)
eGFR: 94 mL/min/{1.73_m2} (ref >59)

## 2021-04-05 LAB — HEMOGLOBIN A1C
Est. average glucose Bld gHb Est-mCnc: 120 mg/dL
Hgb A1c MFr Bld: 5.8 % — ABNORMAL HIGH (ref 4.8–5.6)

## 2021-04-05 LAB — INSULIN, RANDOM: INSULIN: 29.9 u[IU]/mL — ABNORMAL HIGH (ref 2.6–24.9)

## 2021-04-05 LAB — T3: T3, Total: 154 ng/dL (ref 71–180)

## 2021-04-05 LAB — T4: T4, Total: 7.5 ug/dL (ref 4.5–12.0)

## 2021-04-05 LAB — FOLATE: Folate: 11.3 ng/mL (ref >3.0)

## 2021-04-05 LAB — VITAMIN D 25 HYDROXY (VIT D DEFICIENCY, FRACTURES): Vit D, 25-Hydroxy: 16.5 ng/mL — ABNORMAL LOW (ref 30.0–100.0)

## 2021-04-05 NOTE — Progress Notes (Signed)
Dear Dr. Nadene Rubins,   Thank you for referring Michelle Haynes to our clinic. The following note includes my evaluation and treatment recommendations.  Chief Complaint:   OBESITY Michelle Haynes (MR# [id number]) is a 60 y.o. female who presents for evaluation and treatment of obesity and related comorbidities. Current BMI is Body mass index is 33.99 kg/m. Michelle Haynes has been struggling with her weight for many years and has been unsuccessful in either losing weight, maintaining weight loss, or reaching her healthy weight goal.  Michelle Haynes is currently in the action stage of change and ready to dedicate time achieving and maintaining a healthier weight. Michelle Haynes is interested in becoming our patient and working on intensive lifestyle modifications including (but not limited to) diet and exercise for weight loss.  Michelle Haynes was referred by Dr. Nadene Rubins. 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 Slow weight gain for the last 17 years. She previously did Weight Watchers. 1/2 cup unsweetened chocolate almond milk, 1/3 oatmeal, 1/2 scoop chocolate PB powder or Rx bar with coffee (satisfied), 1 piece of chocolate as a snack (bite sized candies); Lunch- salad (~2 egg whites) or [employer] (1/2 sandwich or 1/2 salad) (satisfied) + fruit (1/3 cup cherries); Afternoon snack- coffee + 2 pieces of candy; Dinner- egg roll bowl from [employer]- ground chicken, scallions, ginger, garlic, apricot jam, ~1/2 cup brown rice + 1 white claw at bedtime.  Michelle Haynes habits were reviewed today and are as follows: her desired weight loss is 63 lbs, she started gaining weight in her late 79's, she has significant food cravings issues, she snacks frequently in the evenings, she wakes up frequently in the middle of the night to eat, she is frequently drinking liquids with calories, she frequently makes poor food choices, and she struggles with emotional eating.  Depression Screen Michelle Haynes Food and Mood (modified PHQ-9) score was  11.  Depression screen [facility] 2/9 04/04/2021  Decreased Interest 3  Down, Depressed, Hopeless 3  PHQ - 2 Score 6  Altered sleeping 1  Tired, decreased energy 1  Change in appetite 1  Feeling bad or failure about yourself  2  Trouble concentrating 0  Moving slowly or fidgety/restless 0  Suicidal thoughts 0  PHQ-9 Score 11  Difficult doing work/chores Somewhat difficult   Subjective:   1. Other fatigue Akeiba admits to daytime somnolence and admits to waking up still tired. Patent has a history of symptoms of daytime fatigue and morning fatigue. Shaylinn generally gets 7 or 8 hours of sleep per night, and states that she has poor sleep quality. Snoring "I don't know" present. Apneic episodes are not present. Epworth Sleepiness Score is 5. EKG normal sinus rhythm at 86 bpm.  2. SOB (shortness of breath) Wei notes increasing shortness of breath with exercising and seems to be worsening over time with weight gain. She notes getting out of breath sooner with activity than she used to. This has gotten worse recently. Buffey denies shortness of breath at rest or orthopnea. EKG normal sinus rhythm at 86 bpm.  3. Essential hypertension Diagnosed about 10 years ago. Yatziry denies chest pain/chest pressure/headache. Her BP is controlled today. She is not on meds.  4. Mixed hyperlipidemia Diagnosed about 10 years ago. She is on statin therapy.  5. Anxiety and depression Michelle Haynes is on Paxil and Xanax. She still needs Xanax frequently.  6. Elevated LFTs Michelle Haynes has an elevated ALT. She is on a statin.  7. At risk for deficient intake of food Michelle Haynes is  at risk of deficient intake of food.  Assessment/Plan:   1. Other fatigue Michelle Haynes does feel that her weight is causing her energy to be lower than it should be. Fatigue may be related to obesity, depression or many other causes. Labs will be ordered, and in the meanwhile, Anyssa will focus on self care including making healthy food choices, increasing  physical activity and focusing on stress reduction. Check labs today.  - EKG 12-Lead - Vitamin B12 - Folate - Hemoglobin A1c - Insulin, random - T3 - T4 - VITAMIN D 25 Hydroxy (Vit-D Deficiency, Fractures)  2. SOB (shortness of breath) Shatonia does feel that she gets out of breath more easily that she used to when she exercises. Michelle Haynes's shortness of breath appears to be obesity related and exercise induced. She has agreed to work on weight loss and gradually increase exercise to treat her exercise induced shortness of breath. Will continue to monitor closely.  3. Essential hypertension Michelle Haynes is working on healthy weight loss and exercise to improve blood pressure control. We will watch for signs of hypotension as she continues her lifestyle modifications. Check labs today.  - Comprehensive metabolic panel  4. Mixed hyperlipidemia Cardiovascular risk and specific lipid/LDL goals reviewed.  We discussed several lifestyle modifications today and Michelle Haynes will continue to work on diet, exercise and weight loss efforts. Orders and follow up as documented in patient record.   Counseling Intensive lifestyle modifications are the first line treatment for this issue. Dietary changes: Increase soluble fiber. Decrease simple carbohydrates. Exercise changes: Moderate to vigorous-intensity aerobic activity 150 minutes per week if tolerated. Lipid-lowering medications: see documented in medical record.  5. Anxiety and depression Behavior modification techniques were discussed today to help Michelle Haynes deal with her anxiety.  Orders and follow up as documented in patient record. Follow up on switching medication at next appt.  6. Elevated LFTs We discussed the likely diagnosis of non-alcoholic fatty liver disease today and how this condition is obesity related. Michelle Haynes was educated the importance of weight loss. Michelle Haynes agreed to continue with her weight loss efforts with healthier diet and exercise as an  essential part of her treatment plan. Check labs today.  7. At risk for deficient intake of food Michelle Haynes was given approximately 15 minutes of deficit intake of food prevention counseling today. Michelle Haynes is at risk for eating too few calories based on current food recall. She was encouraged to focus on meeting caloric and protein goals according to her recommended meal plan.    8. Obesity with current BMI of 34.0  Michelle Haynes is currently in the action stage of change and her goal is to continue with weight loss efforts. I recommend Michelle Haynes begin the structured treatment plan as follows:  She has agreed to the Category 4 Plan.  Exercise goals:  As is    Behavioral modification strategies: increasing lean protein intake, meal planning and cooking strategies, keeping healthy foods in the home, and planning for success.  She was informed of the importance of frequent follow-up visits to maximize her success with intensive lifestyle modifications for her multiple health conditions. She was informed we would discuss her lab results at her next visit unless there is a critical issue that needs to be addressed sooner. Michelle Haynes agreed to keep her next visit at the agreed upon time to discuss these results.  Objective:   Blood pressure 109/69, pulse 86, temperature 98.3 F (36.8 C), height 5\' 4"  (1.626 m), weight 198 lb (89.8 kg), last menstrual period  03/03/2018, SpO2 97 %. Body mass index is 33.99 kg/m.  EKG: Normal sinus rhythm, rate 86.  Indirect Calorimeter completed today shows a VO2 of 336 and a REE of 2342.  Her calculated basal metabolic rate is 8416 thus her basal metabolic rate is better than expected.  General: Cooperative, alert, well developed, in no acute distress. HEENT: Conjunctivae and lids unremarkable. Cardiovascular: Regular rhythm.  Lungs: Normal work of breathing. Neurologic: No focal deficits.   Lab Results  Component Value Date   CREATININE 0.74 04/04/2021   BUN 15 04/04/2021    NA 141 04/04/2021   K 4.8 04/04/2021   CL 102 04/04/2021   CO2 25 04/04/2021   Lab Results  Component Value Date   ALT 35 (H) 04/04/2021   AST 23 04/04/2021   ALKPHOS 102 04/04/2021   BILITOT 0.5 04/04/2021   Lab Results  Component Value Date   HGBA1C 5.8 (H) 04/04/2021   Lab Results  Component Value Date   INSULIN 29.9 (H) 04/04/2021   Lab Results  Component Value Date   TSH 0.74 01/13/2021   Lab Results  Component Value Date   CHOL 192 01/13/2021   HDL 52 01/13/2021   LDLCALC 100 01/13/2021   TRIG 199 (A) 01/13/2021   Lab Results  Component Value Date   WBC 7.2 01/13/2021   HGB 13.7 01/13/2021   HCT 43 01/13/2021   MCV 92.7 03/18/2016   PLT 327 01/13/2021    Attestation Statements:   Reviewed by clinician on day of visit: allergies, medications, problem list, medical history, surgical history, family history, social history, and previous encounter notes.  Edmund Hilda, CMA, am acting as transcriptionist for Reuben Likes, MD.   This is the patient's first visit at [facility]. The patient's NEW PATIENT PACKET was reviewed at length. Included in the packet: current and past health history, medications, allergies, ROS, gynecologic history (women only), surgical history, family history, social history, weight history, weight loss surgery history (for those that have had weight loss surgery), nutritional evaluation, mood and food questionnaire, PHQ9, Epworth questionnaire, sleep habits questionnaire, patient life and health improvement goals questionnaire. These will all be scanned into the patient's chart under media.   During the visit, I independently reviewed the patient's EKG, bioimpedance scale results, and indirect calorimeter results. I used this information to tailor a meal plan for the patient that will help her to lose weight and will improve her obesity-related conditions going forward. I performed a medically necessary appropriate  examination and/or evaluation. I discussed the assessment and treatment plan with the patient. The patient was provided an opportunity to ask questions and all were answered. The patient agreed with the plan and demonstrated an understanding of the instructions. Labs were ordered at this visit and will be reviewed at the next visit unless more critical results need to be addressed immediately. Clinical information was updated and documented in the EMR.   Time spent on visit including pre-visit chart review and post-visit care was 45 minutes.   A separate 15 minutes was spent on risk counseling (see above).  I have reviewed the above documentation for accuracy and completeness, and I agree with the above. - Reuben Likes, MD

## 2021-04-05 NOTE — Telephone Encounter (Signed)
Please advise 

## 2021-04-18 ENCOUNTER — Ambulatory Visit (INDEPENDENT_AMBULATORY_CARE_PROVIDER_SITE_OTHER): Payer: No Typology Code available for payment source | Admitting: Family Medicine

## 2021-04-18 ENCOUNTER — Other Ambulatory Visit: Payer: Self-pay

## 2021-04-18 ENCOUNTER — Other Ambulatory Visit (HOSPITAL_COMMUNITY): Payer: Self-pay

## 2021-04-18 ENCOUNTER — Encounter (INDEPENDENT_AMBULATORY_CARE_PROVIDER_SITE_OTHER): Payer: Self-pay | Admitting: Family Medicine

## 2021-04-18 VITALS — BP 109/72 | HR 89 | Temp 98.1°F | Ht 64.0 in | Wt 197.0 lb

## 2021-04-18 DIAGNOSIS — R7401 Elevation of levels of liver transaminase levels: Secondary | ICD-10-CM | POA: Diagnosis not present

## 2021-04-18 DIAGNOSIS — R7303 Prediabetes: Secondary | ICD-10-CM | POA: Diagnosis not present

## 2021-04-18 DIAGNOSIS — E669 Obesity, unspecified: Secondary | ICD-10-CM

## 2021-04-18 DIAGNOSIS — Z9189 Other specified personal risk factors, not elsewhere classified: Secondary | ICD-10-CM

## 2021-04-18 DIAGNOSIS — E559 Vitamin D deficiency, unspecified: Secondary | ICD-10-CM

## 2021-04-18 DIAGNOSIS — Z6833 Body mass index (BMI) 33.0-33.9, adult: Secondary | ICD-10-CM

## 2021-04-18 MED ORDER — VITAMIN D (ERGOCALCIFEROL) 1.25 MG (50000 UNIT) PO CAPS
50000.0000 [IU] | ORAL_CAPSULE | ORAL | 0 refills | Status: DC
  Filled 2021-04-18: qty 8, 28d supply, fill #0

## 2021-04-19 NOTE — Progress Notes (Signed)
Chief Complaint:   OBESITY Michelle Haynes is here to discuss her progress with her obesity treatment plan along with follow-up of her obesity related diagnoses. Michelle Haynes is on the Category 4 Plan and states she is following her eating plan approximately 50% of the time. Michelle Haynes states she is exercising 30 minutes 3-5 times per week.  Today's visit was #: 2 Starting weight: 198 lbs Starting date: 04/04/2021 Today's weight: 197 lbs Today's date: 04/18/2021 Total lbs lost to date: 1 Total lbs lost since last in-office visit: 1  Interim History: Michelle Haynes is finding protein at breakfast and dinner to be difficult. She is substituting some [employer] protein chips or [place] chips. She is doing 100 calorie [employer] as a substitution for breakfast. She is only able to get ~7 oz of protein at dinner and vegetables. She thinks the next few weeks she can employ better planning to get up to 75-80%. She is traveling to [place] in early September.  Subjective:   1. Vitamin D deficiency Michelle Haynes is not on a Vit D supplement. She reports fatigue. Her last Vit D level was 16.5.  2. Pre-diabetes Pt's last A1c was 5.8 and insulin level 29.9. She is not on medication.  3. Transaminitis Michelle Haynes's last ALT 35, AST 23, and alkaline phosphate 102. 2017 CT of liver within normal limits.  4. At risk of diabetes mellitus Michelle Haynes is at higher than average risk for developing diabetes due to obesity.   Assessment/Plan:   1. Vitamin D deficiency Low Vitamin D level contributes to fatigue and are associated with obesity, breast, and colon cancer. She agrees to start to take prescription Vitamin D 50,000 IU every week and will follow-up for routine testing of Vitamin D, at least 2-3 times per year to avoid over-replacement.  Start- Vitamin D, Ergocalciferol, (DRISDOL) 1.25 MG (50000 UNIT) CAPS capsule; Take 1 capsule (50,000 Units total) by mouth 2 (two) times a week.  Dispense: 8 capsule; Refill: 0  2. Pre-diabetes Michelle Haynes will  continue to work on weight loss, exercise, and decreasing simple carbohydrates to help decrease the risk of diabetes. Repeat labs in 3 months.  3. Transaminitis Follow up at next lab draw.  4. At risk of diabetes mellitus Michelle Haynes was given approximately 30 minutes of diabetes education and counseling today. We discussed intensive lifestyle modifications today with an emphasis on weight loss as well as increasing exercise and decreasing simple carbohydrates in her diet. We also reviewed medication options with an emphasis on risk versus benefit of those discussed.   Repetitive spaced learning was employed today to elicit superior memory formation and behavioral change.  5. Obesity with current BMI of 33.9  Michelle Haynes is currently in the action stage of change. As such, her goal is to continue with weight loss efforts. She has agreed to the Category 4 Plan with breakfast options.   Exercise goals:  As is  Behavioral modification strategies: increasing lean protein intake, meal planning and cooking strategies, keeping healthy foods in the home, and planning for success.  Michelle Haynes has agreed to follow-up with our clinic in 2 weeks. She was informed of the importance of frequent follow-up visits to maximize her success with intensive lifestyle modifications for her multiple health conditions.   Objective:   Blood pressure 109/72, pulse 89, temperature 98.1 F (36.7 C), height 5\' 4"  (1.626 m), weight 197 lb (89.4 kg), last menstrual period 03/03/2018, SpO2 100 %. Body mass index is 33.81 kg/m.  General: Cooperative, alert, well developed, in  no acute distress. HEENT: Conjunctivae and lids unremarkable. Cardiovascular: Regular rhythm.  Lungs: Normal work of breathing. Neurologic: No focal deficits.   Lab Results  Component Value Date   CREATININE 0.74 04/04/2021   BUN 15 04/04/2021   NA 141 04/04/2021   K 4.8 04/04/2021   CL 102 04/04/2021   CO2 25 04/04/2021   Lab Results  Component Value  Date   ALT 35 (H) 04/04/2021   AST 23 04/04/2021   ALKPHOS 102 04/04/2021   BILITOT 0.5 04/04/2021   Lab Results  Component Value Date   HGBA1C 5.8 (H) 04/04/2021   Lab Results  Component Value Date   INSULIN 29.9 (H) 04/04/2021   Lab Results  Component Value Date   TSH 0.74 01/13/2021   Lab Results  Component Value Date   CHOL 192 01/13/2021   HDL 52 01/13/2021   LDLCALC 100 01/13/2021   TRIG 199 (A) 01/13/2021   Lab Results  Component Value Date   VD25OH 16.5 (L) 04/04/2021   Lab Results  Component Value Date   WBC 7.2 01/13/2021   HGB 13.7 01/13/2021   HCT 43 01/13/2021   MCV 92.7 03/18/2016   PLT 327 01/13/2021    Attestation Statements:   Reviewed by clinician on day of visit: allergies, medications, problem list, medical history, surgical history, family history, social history, and previous encounter notes.  Edmund Hilda, CMA, am acting as transcriptionist for Reuben Likes, MD.   I have reviewed the above documentation for accuracy and completeness, and I agree with the above. - Reuben Likes, MD

## 2021-05-04 ENCOUNTER — Other Ambulatory Visit (HOSPITAL_COMMUNITY): Payer: Self-pay

## 2021-05-04 MED ORDER — ROSUVASTATIN CALCIUM 20 MG PO TABS
20.0000 mg | ORAL_TABLET | Freq: Every evening | ORAL | 3 refills | Status: DC
  Filled 2021-05-04: qty 90, 90d supply, fill #0
  Filled 2021-08-15: qty 90, 90d supply, fill #1
  Filled 2021-11-20: qty 90, 90d supply, fill #2
  Filled 2022-02-19: qty 90, 90d supply, fill #3

## 2021-05-05 ENCOUNTER — Other Ambulatory Visit (HOSPITAL_COMMUNITY): Payer: Self-pay

## 2021-05-09 ENCOUNTER — Other Ambulatory Visit (HOSPITAL_COMMUNITY): Payer: Self-pay

## 2021-05-09 MED ORDER — AMOXICILLIN-POT CLAVULANATE 875-125 MG PO TABS
1.0000 | ORAL_TABLET | Freq: Two times a day (BID) | ORAL | 0 refills | Status: AC
  Filled 2021-05-09: qty 14, 7d supply, fill #0

## 2021-05-11 ENCOUNTER — Other Ambulatory Visit (HOSPITAL_COMMUNITY): Payer: Self-pay

## 2021-05-11 MED ORDER — PENICILLIN V POTASSIUM 500 MG PO TABS
500.0000 mg | ORAL_TABLET | Freq: Two times a day (BID) | ORAL | 0 refills | Status: DC
  Filled 2021-05-11: qty 10, 5d supply, fill #0

## 2021-05-22 ENCOUNTER — Ambulatory Visit (INDEPENDENT_AMBULATORY_CARE_PROVIDER_SITE_OTHER): Payer: No Typology Code available for payment source | Admitting: Family Medicine

## 2021-06-06 ENCOUNTER — Ambulatory Visit (INDEPENDENT_AMBULATORY_CARE_PROVIDER_SITE_OTHER): Payer: No Typology Code available for payment source | Admitting: Family Medicine

## 2021-07-05 ENCOUNTER — Other Ambulatory Visit (HOSPITAL_COMMUNITY): Payer: Self-pay

## 2021-07-05 MED ORDER — AMOXICILLIN 500 MG PO CAPS
500.0000 mg | ORAL_CAPSULE | Freq: Three times a day (TID) | ORAL | 0 refills | Status: DC
  Filled 2021-07-05: qty 30, 10d supply, fill #0

## 2021-07-06 ENCOUNTER — Other Ambulatory Visit (HOSPITAL_COMMUNITY): Payer: Self-pay

## 2021-08-15 ENCOUNTER — Other Ambulatory Visit (HOSPITAL_COMMUNITY): Payer: Self-pay

## 2021-09-05 ENCOUNTER — Other Ambulatory Visit (HOSPITAL_COMMUNITY): Payer: Self-pay

## 2021-09-05 MED ORDER — AMOXICILLIN 500 MG PO CAPS
500.0000 mg | ORAL_CAPSULE | Freq: Three times a day (TID) | ORAL | 0 refills | Status: DC
  Filled 2021-09-05: qty 30, 10d supply, fill #0

## 2021-11-20 ENCOUNTER — Other Ambulatory Visit (HOSPITAL_COMMUNITY): Payer: Self-pay

## 2021-12-04 ENCOUNTER — Other Ambulatory Visit: Payer: Self-pay | Admitting: Internal Medicine

## 2021-12-04 DIAGNOSIS — Z1231 Encounter for screening mammogram for malignant neoplasm of breast: Secondary | ICD-10-CM

## 2022-01-15 ENCOUNTER — Other Ambulatory Visit: Payer: Self-pay | Admitting: Orthopaedic Surgery

## 2022-01-22 NOTE — Progress Notes (Signed)
Surgical Instructions    Your procedure is scheduled on Thursday, May 25th, 2023.   Report to [facility] Main Entrance "A" at 11:30 A.M., then check in with the Admitting office.  Call this number if you have problems the morning of surgery:  [phone]   If you have any questions prior to your surgery date call [phone]: Open Monday-Friday 8am-4pm    Remember:  Do not eat after midnight the night before your surgery  You may drink clear liquids until 10:30 the morning of your surgery.   Clear liquids allowed are: Water, Non-Citrus Juices (without pulp), Carbonated Beverages, Clear Tea, Black Coffee ONLY (NO MILK, CREAM OR POWDERED CREAMER of any kind), and Gatorade  Patient Instructions  The night before surgery:  No food after midnight. ONLY clear liquids after midnight  The day of surgery (if you do NOT have diabetes):  Drink ONE (1) Pre-Surgery Clear Ensure by 10:30 the morning of surgery. Drink in one sitting. Do not sip.  This drink was given to you during your hospital  pre-op appointment visit.  Nothing else to drink after completing the  Pre-Surgery Clear Ensure.         If you have questions, please contact your surgeon's office.     Take these medicines the morning of surgery with A SIP OF WATER:   If needed:  ALPRAZolam Duanne Moron) HYDROcodone-acetaminophen (NORCO/VICODIN)   Follow your surgeon's instructions on when to stop Aspirin.  If no instructions were given by your surgeon then you will need to call the office to get those instructions.     As of today, STOP taking any Aspirin (unless otherwise instructed by your surgeon) Aleve, Naproxen, Ibuprofen, Motrin, Advil, Goody's, BC's, all herbal medications, fish oil, and all vitamins.    The day of surgery:          Do not wear jewelry or makeup Do not wear lotions, powders, perfumes, or deodorant. Do not shave 48 hours prior to surgery.   Do not bring valuables to the hospital. Do not wear nail  polish, gel polish, artificial nails, or any other type of covering on natural nails (fingers and toes) If you have artificial nails or gel coating that need to be removed by a nail salon, please have this removed prior to surgery. Artificial nails or gel coating may interfere with anesthesia's ability to adequately monitor your vital signs.  [facility] is not responsible for any belongings or valuables. .   Do NOT Smoke (Tobacco/Vaping)  24 hours prior to your procedure  If you use a CPAP at night, you may bring your mask for your overnight stay.   Contacts, glasses, hearing aids, dentures or partials may not be worn into surgery, please bring cases for these belongings   For patients admitted to the hospital, discharge time will be determined by your treatment team.   Patients discharged the day of surgery will not be allowed to drive home, and someone needs to stay with them for 24 hours.   SURGICAL WAITING ROOM VISITATION Patients having surgery or a procedure in a hospital may have two support people. Children under the age of 81 must have an adult with them who is not the patient. They may stay in the waiting area during the procedure and may switch out with other visitors. If the patient needs to stay at the hospital during part of their recovery, the visitor guidelines for inpatient rooms apply.  Please refer to the [facility] website for the  visitor guidelines for Inpatients (after your surgery is over and you are in a regular room).    Special instructions:    Oral Hygiene is also important to reduce your risk of infection.  Remember - BRUSH YOUR TEETH THE MORNING OF SURGERY WITH YOUR REGULAR TOOTHPASTE   [place]- Preparing For Surgery  Before surgery, you can play an important role. Because skin is not sterile, your skin needs to be as free of germs as possible. You can reduce the number of germs on your skin by washing with CHG (chlorahexidine gluconate) Soap before  surgery.  CHG is an antiseptic cleaner which kills germs and bonds with the skin to continue killing germs even after washing.     Please do not use if you have an allergy to CHG or antibacterial soaps. If your skin becomes reddened/irritated stop using the CHG.  Do not shave (including legs and underarms) for at least 48 hours prior to first CHG shower. It is OK to shave your face.  Please follow these instructions carefully.     Shower the NIGHT BEFORE SURGERY and the MORNING OF SURGERY with CHG Soap.   If you chose to wash your hair, wash your hair first as usual with your normal shampoo. After you shampoo, rinse your hair and body thoroughly to remove the shampoo.  Then [employer] and genitals (private parts) with your normal soap and rinse thoroughly to remove soap.  After that Use CHG Soap as you would any other liquid soap. You can apply CHG directly to the skin and wash gently with a scrungie or a clean washcloth.   Apply the CHG Soap to your body ONLY FROM THE NECK DOWN.  Do not use on open wounds or open sores. Avoid contact with your eyes, ears, mouth and genitals (private parts). Wash Face and genitals (private parts)  with your normal soap.   Wash thoroughly, paying special attention to the area where your surgery will be performed.  Thoroughly rinse your body with warm water from the neck down.  DO NOT shower/wash with your normal soap after using and rinsing off the CHG Soap.  Pat yourself dry with a CLEAN TOWEL.  Wear CLEAN PAJAMAS to bed the night before surgery  Place CLEAN SHEETS on your bed the night before your surgery  DO NOT SLEEP WITH PETS.   Day of Surgery:  Take a shower with CHG soap. Wear Clean/Comfortable clothing the morning of surgery Do not apply any deodorants/lotions.   Remember to brush your teeth WITH YOUR REGULAR TOOTHPASTE.    If you received a COVID test during your pre-op visit, it is requested that you wear a mask when out in public,  stay away from anyone that may not be feeling well, and notify your surgeon if you develop symptoms. If you have been in contact with anyone that has tested positive in the last 10 days, please notify your surgeon.    Please read over the following fact sheets that you were given.

## 2022-01-23 ENCOUNTER — Other Ambulatory Visit: Payer: Self-pay

## 2022-01-23 ENCOUNTER — Encounter (HOSPITAL_COMMUNITY): Payer: Self-pay

## 2022-01-23 ENCOUNTER — Encounter (HOSPITAL_COMMUNITY)
Admission: RE | Admit: 2022-01-23 | Discharge: 2022-01-23 | Disposition: A | Discharge status: Home / Self Care | Payer: No Typology Code available for payment source | Source: Ambulatory Visit | Attending: Orthopaedic Surgery | Admitting: Orthopaedic Surgery

## 2022-01-23 VITALS — BP 147/92 | HR 99 | Temp 97.8°F | Resp 18 | Ht 64.0 in | Wt 190.0 lb

## 2022-01-23 DIAGNOSIS — Z01812 Encounter for preprocedural laboratory examination: Secondary | ICD-10-CM | POA: Insufficient documentation

## 2022-01-23 DIAGNOSIS — I10 Essential (primary) hypertension: Secondary | ICD-10-CM | POA: Diagnosis not present

## 2022-01-23 DIAGNOSIS — Z01818 Encounter for other preprocedural examination: Secondary | ICD-10-CM

## 2022-01-23 LAB — BASIC METABOLIC PANEL
Anion gap: 11 (ref 5–15)
BUN: 15 mg/dL (ref 6–20)
CO2: 24 mmol/L (ref 22–32)
Calcium: 10.4 mg/dL — ABNORMAL HIGH (ref 8.9–10.3)
Chloride: 104 mmol/L (ref 98–111)
Creatinine, Ser: 0.81 mg/dL (ref 0.44–1.00)
GFR, Estimated: >60 mL/min (ref >60)
Glucose, Bld: 97 mg/dL (ref 70–99)
Potassium: 4.3 mmol/L (ref 3.5–5.1)
Sodium: 139 mmol/L (ref 135–145)

## 2022-01-23 LAB — CBC
HCT: 43 % (ref 36.0–46.0)
Hemoglobin: 14 g/dL (ref 12.0–15.0)
MCH: 30 pg (ref 26.0–34.0)
MCHC: 32.6 g/dL (ref 30.0–36.0)
MCV: 92.3 fL (ref 80.0–100.0)
Platelets: 431 10*3/uL — ABNORMAL HIGH (ref 150–400)
RBC: 4.66 MIL/uL (ref 3.87–5.11)
RDW: 12.9 % (ref 11.5–15.5)
WBC: 9.9 10*3/uL (ref 4.0–10.5)
nRBC: 0 % (ref 0.0–0.2)

## 2022-01-23 LAB — SURGICAL PCR SCREEN
MRSA, PCR: NEGATIVE
Staphylococcus aureus: NEGATIVE

## 2022-01-23 LAB — HEMOGLOBIN A1C
Hgb A1c MFr Bld: 5.7 % — ABNORMAL HIGH (ref 4.8–5.6)
Mean Plasma Glucose: 116.89 mg/dL

## 2022-01-23 NOTE — Progress Notes (Signed)
  PCP - Cristie Hem MD  Cardiologist - Denies  PPM/ICD - Denies Device Orders -  Rep Notified -   Chest x-ray - NI  EKG - 04/04/21 Stress Test - Denies ECHO - Denies Cardiac Cath - Denies  Sleep Study - Denies  DM - Denies  Blood Thinner Instructions: Denies Aspirin Instructions:Requested that patient call Dr. Lucia Gaskins for instruction on when to stop.   ERAS Protcol - Yes  PRE-SURGERY Ensure given  COVID TEST- NI   Anesthesia review: No  Patient denies shortness of breath, fever, cough and chest pain at PAT appointment   All instructions explained to the patient, with a verbal understanding of the material. Patient agrees to go over the instructions while at home for a better understanding.  The opportunity to ask questions was provided.

## 2022-01-25 ENCOUNTER — Encounter (HOSPITAL_COMMUNITY): Payer: Self-pay | Admitting: Orthopaedic Surgery

## 2022-01-25 ENCOUNTER — Ambulatory Visit (HOSPITAL_COMMUNITY)
Admission: RE | Admit: 2022-01-25 | Discharge: 2022-01-26 | Disposition: A | Discharge status: Home / Self Care | Payer: No Typology Code available for payment source | Attending: Orthopaedic Surgery | Admitting: Orthopaedic Surgery

## 2022-01-25 ENCOUNTER — Encounter (HOSPITAL_COMMUNITY)
Admission: RE | Disposition: A | Discharge status: Home / Self Care | Payer: Self-pay | Source: Home / Self Care | Attending: Orthopaedic Surgery

## 2022-01-25 ENCOUNTER — Ambulatory Visit (HOSPITAL_COMMUNITY): Payer: No Typology Code available for payment source | Admitting: Anesthesiology

## 2022-01-25 ENCOUNTER — Ambulatory Visit (HOSPITAL_BASED_OUTPATIENT_CLINIC_OR_DEPARTMENT_OTHER): Payer: No Typology Code available for payment source | Admitting: Anesthesiology

## 2022-01-25 ENCOUNTER — Ambulatory Visit (HOSPITAL_COMMUNITY): Payer: No Typology Code available for payment source

## 2022-01-25 ENCOUNTER — Other Ambulatory Visit: Payer: Self-pay

## 2022-01-25 ENCOUNTER — Ambulatory Visit (HOSPITAL_COMMUNITY)
Admission: RE | Admit: 2022-01-25 | Discharge: 2022-01-25 | Disposition: A | Discharge status: Home / Self Care | Payer: No Typology Code available for payment source | Source: Ambulatory Visit | Attending: Orthopaedic Surgery | Admitting: Orthopaedic Surgery

## 2022-01-25 DIAGNOSIS — M25371 Other instability, right ankle: Secondary | ICD-10-CM | POA: Insufficient documentation

## 2022-01-25 DIAGNOSIS — S9304XA Dislocation of right ankle joint, initial encounter: Secondary | ICD-10-CM | POA: Diagnosis present

## 2022-01-25 DIAGNOSIS — W19XXXA Unspecified fall, initial encounter: Secondary | ICD-10-CM | POA: Diagnosis not present

## 2022-01-25 DIAGNOSIS — S93439A Sprain of tibiofibular ligament of unspecified ankle, initial encounter: Secondary | ICD-10-CM | POA: Diagnosis not present

## 2022-01-25 DIAGNOSIS — I10 Essential (primary) hypertension: Secondary | ICD-10-CM | POA: Diagnosis not present

## 2022-01-25 DIAGNOSIS — S93431A Sprain of tibiofibular ligament of right ankle, initial encounter: Secondary | ICD-10-CM | POA: Diagnosis not present

## 2022-01-25 HISTORY — PX: SYNDESMOSIS REPAIR: SHX5182

## 2022-01-25 HISTORY — PX: LIGAMENT REPAIR: SHX5444

## 2022-01-25 SURGERY — REPAIR, SYNDESMOSIS, ANKLE
Anesthesia: General | Site: Ankle | Laterality: Right

## 2022-01-25 MED ORDER — METHOCARBAMOL 500 MG PO TABS: 500.0000 mg | ORAL_TABLET | Freq: Four times a day (QID) | ORAL | Status: DC | PRN

## 2022-01-25 MED ORDER — CHLORHEXIDINE GLUCONATE 0.12 % MT SOLN
15.0000 mL | Freq: Once | OROMUCOSAL | Status: AC
  Administered 2022-01-25: 15 mL via OROMUCOSAL
  Filled 2022-01-25: qty 15

## 2022-01-25 MED ORDER — PROPOFOL 10 MG/ML IV BOLUS
INTRAVENOUS | Status: AC
  Filled 2022-01-25: qty 20

## 2022-01-25 MED ORDER — LACTATED RINGERS IV SOLN: INTRAVENOUS | Status: DC

## 2022-01-25 MED ORDER — PHENYLEPHRINE HCL-NACL 20-0.9 MG/250ML-% IV SOLN
INTRAVENOUS | Status: DC | PRN
  Administered 2022-01-25: 30 ug/min via INTRAVENOUS

## 2022-01-25 MED ORDER — ACETAMINOPHEN 325 MG PO TABS: 325.0000 mg | ORAL_TABLET | Freq: Four times a day (QID) | ORAL | Status: DC | PRN

## 2022-01-25 MED ORDER — MIDAZOLAM HCL 2 MG/2ML IJ SOLN
INTRAMUSCULAR | Status: DC | PRN
Start: 2022-01-25 — End: 2022-01-25
  Administered 2022-01-25 (×2): 1 mg via INTRAVENOUS

## 2022-01-25 MED ORDER — METOCLOPRAMIDE HCL 5 MG PO TABS: 5.0000 mg | ORAL_TABLET | Freq: Three times a day (TID) | ORAL | Status: DC | PRN

## 2022-01-25 MED ORDER — FENTANYL CITRATE (PF) 250 MCG/5ML IJ SOLN
INTRAMUSCULAR | Status: AC
  Filled 2022-01-25: qty 5

## 2022-01-25 MED ORDER — ONDANSETRON HCL 4 MG/2ML IJ SOLN
INTRAMUSCULAR | Status: DC | PRN
  Administered 2022-01-25: 4 mg via INTRAVENOUS

## 2022-01-25 MED ORDER — CEFAZOLIN SODIUM-DEXTROSE 2-4 GM/100ML-% IV SOLN
2.0000 g | INTRAVENOUS | Status: AC
  Administered 2022-01-25: 2 g via INTRAVENOUS
  Filled 2022-01-25: qty 100

## 2022-01-25 MED ORDER — MORPHINE SULFATE (PF) 2 MG/ML IV SOLN: 0.5000 mg | INTRAVENOUS | Status: DC | PRN

## 2022-01-25 MED ORDER — ACETAMINOPHEN 500 MG PO TABS
500.0000 mg | ORAL_TABLET | Freq: Four times a day (QID) | ORAL | Status: DC
  Administered 2022-01-25 – 2022-01-26 (×3): 500 mg via ORAL
  Filled 2022-01-25 (×3): qty 1

## 2022-01-25 MED ORDER — HYDROCODONE-ACETAMINOPHEN 7.5-325 MG PO TABS: 1.0000 | ORAL_TABLET | ORAL | Status: DC | PRN

## 2022-01-25 MED ORDER — HYDROCODONE-ACETAMINOPHEN 5-325 MG PO TABS: 1.0000 | ORAL_TABLET | ORAL | Status: DC | PRN

## 2022-01-25 MED ORDER — LIDOCAINE 2% (20 MG/ML) 5 ML SYRINGE
INTRAMUSCULAR | Status: AC
  Filled 2022-01-25: qty 5

## 2022-01-25 MED ORDER — NAPROXEN 250 MG PO TABS
250.0000 mg | ORAL_TABLET | Freq: Two times a day (BID) | ORAL | Status: DC
  Administered 2022-01-25: 250 mg via ORAL
  Filled 2022-01-25 (×2): qty 1

## 2022-01-25 MED ORDER — LIDOCAINE 2% (20 MG/ML) 5 ML SYRINGE
INTRAMUSCULAR | Status: DC | PRN
  Administered 2022-01-25: 80 mg via INTRAVENOUS

## 2022-01-25 MED ORDER — CEFAZOLIN SODIUM-DEXTROSE 2-4 GM/100ML-% IV SOLN
2.0000 g | Freq: Four times a day (QID) | INTRAVENOUS | Status: DC
  Administered 2022-01-25 – 2022-01-26 (×2): 2 g via INTRAVENOUS
  Filled 2022-01-25 (×3): qty 100

## 2022-01-25 MED ORDER — FENTANYL CITRATE (PF) 250 MCG/5ML IJ SOLN
INTRAMUSCULAR | Status: DC | PRN
  Administered 2022-01-25 (×2): 25 ug via INTRAVENOUS

## 2022-01-25 MED ORDER — CLONIDINE HCL (ANALGESIA) 100 MCG/ML EP SOLN
EPIDURAL | Status: DC | PRN
  Administered 2022-01-25: 100 ug

## 2022-01-25 MED ORDER — ENOXAPARIN SODIUM 40 MG/0.4ML IJ SOSY: 40.0000 mg | PREFILLED_SYRINGE | INTRAMUSCULAR | Status: DC

## 2022-01-25 MED ORDER — ONDANSETRON HCL 4 MG PO TABS: 4.0000 mg | ORAL_TABLET | Freq: Four times a day (QID) | ORAL | Status: DC | PRN

## 2022-01-25 MED ORDER — METHOCARBAMOL 1000 MG/10ML IJ SOLN
500.0000 mg | Freq: Four times a day (QID) | INTRAVENOUS | Status: DC | PRN
  Filled 2022-01-25: qty 5

## 2022-01-25 MED ORDER — PAROXETINE HCL 20 MG PO TABS: 20.0000 mg | ORAL_TABLET | Freq: Every day | ORAL | Status: DC

## 2022-01-25 MED ORDER — DEXAMETHASONE SODIUM PHOSPHATE 10 MG/ML IJ SOLN
INTRAMUSCULAR | Status: AC
  Filled 2022-01-25: qty 1

## 2022-01-25 MED ORDER — PROPOFOL 10 MG/ML IV BOLUS
INTRAVENOUS | Status: DC | PRN
  Administered 2022-01-25: 190 mg via INTRAVENOUS

## 2022-01-25 MED ORDER — 0.9 % SODIUM CHLORIDE (POUR BTL) OPTIME
TOPICAL | Status: DC | PRN
  Administered 2022-01-25: 1000 mL

## 2022-01-25 MED ORDER — DIPHENHYDRAMINE HCL 12.5 MG/5ML PO ELIX: 12.5000 mg | ORAL_SOLUTION | ORAL | Status: DC | PRN

## 2022-01-25 MED ORDER — FENTANYL CITRATE (PF) 100 MCG/2ML IJ SOLN
INTRAMUSCULAR | Status: AC
  Administered 2022-01-25: 100 ug
  Filled 2022-01-25: qty 2

## 2022-01-25 MED ORDER — MIDAZOLAM HCL 2 MG/2ML IJ SOLN
INTRAMUSCULAR | Status: AC
  Filled 2022-01-25: qty 2

## 2022-01-25 MED ORDER — DIPHENHYDRAMINE HCL 50 MG/ML IJ SOLN
INTRAMUSCULAR | Status: AC
  Filled 2022-01-25: qty 1

## 2022-01-25 MED ORDER — DOCUSATE SODIUM 100 MG PO CAPS
100.0000 mg | ORAL_CAPSULE | Freq: Two times a day (BID) | ORAL | Status: DC
  Filled 2022-01-25: qty 1

## 2022-01-25 MED ORDER — MIDAZOLAM HCL 2 MG/2ML IJ SOLN
INTRAMUSCULAR | Status: AC
  Administered 2022-01-25: 2 mg
  Filled 2022-01-25: qty 2

## 2022-01-25 MED ORDER — METOCLOPRAMIDE HCL 5 MG/ML IJ SOLN: 5.0000 mg | Freq: Three times a day (TID) | INTRAMUSCULAR | Status: DC | PRN

## 2022-01-25 MED ORDER — ONDANSETRON HCL 4 MG/2ML IJ SOLN: 4.0000 mg | Freq: Once | INTRAMUSCULAR | Status: DC | PRN

## 2022-01-25 MED ORDER — ONDANSETRON HCL 4 MG/2ML IJ SOLN
INTRAMUSCULAR | Status: AC
  Filled 2022-01-25: qty 2

## 2022-01-25 MED ORDER — ACETAMINOPHEN 500 MG PO TABS
1000.0000 mg | ORAL_TABLET | Freq: Once | ORAL | Status: AC
  Administered 2022-01-25: 1000 mg via ORAL
  Filled 2022-01-25: qty 2

## 2022-01-25 MED ORDER — FENTANYL CITRATE (PF) 100 MCG/2ML IJ SOLN: 25.0000 ug | INTRAMUSCULAR | Status: DC | PRN

## 2022-01-25 MED ORDER — ROSUVASTATIN CALCIUM 20 MG PO TABS: 20.0000 mg | ORAL_TABLET | Freq: Every day | ORAL | Status: DC

## 2022-01-25 MED ORDER — SCOPOLAMINE 1 MG/3DAYS TD PT72
1.0000 | MEDICATED_PATCH | TRANSDERMAL | Status: DC
  Administered 2022-01-25: 1.5 mg via TRANSDERMAL
  Filled 2022-01-25: qty 1

## 2022-01-25 MED ORDER — DEXAMETHASONE SODIUM PHOSPHATE 10 MG/ML IJ SOLN
INTRAMUSCULAR | Status: DC | PRN
  Administered 2022-01-25: 4 mg via INTRAVENOUS

## 2022-01-25 MED ORDER — FENTANYL CITRATE (PF) 100 MCG/2ML IJ SOLN: 100.0000 ug | Freq: Once | INTRAMUSCULAR | Status: DC

## 2022-01-25 MED ORDER — ONDANSETRON HCL 4 MG/2ML IJ SOLN: 4.0000 mg | Freq: Four times a day (QID) | INTRAMUSCULAR | Status: DC | PRN

## 2022-01-25 MED ORDER — LOPERAMIDE HCL 2 MG PO CAPS: 2.0000 mg | ORAL_CAPSULE | Freq: Four times a day (QID) | ORAL | Status: DC | PRN

## 2022-01-25 MED ORDER — DIPHENHYDRAMINE HCL 50 MG/ML IJ SOLN
12.5000 mg | Freq: Once | INTRAMUSCULAR | Status: AC
  Administered 2022-01-25: 12.5 mg via INTRAVENOUS

## 2022-01-25 MED ORDER — ORAL CARE MOUTH RINSE: 15.0000 mL | Freq: Once | OROMUCOSAL | Status: AC

## 2022-01-25 MED ORDER — MIDAZOLAM HCL 2 MG/2ML IJ SOLN: 2.0000 mg | Freq: Once | INTRAMUSCULAR | Status: AC

## 2022-01-25 MED ORDER — BUPIVACAINE-EPINEPHRINE (PF) 0.5% -1:200000 IJ SOLN
INTRAMUSCULAR | Status: DC | PRN
  Administered 2022-01-25: 30 mL via PERINEURAL
  Administered 2022-01-25: 10 mL via PERINEURAL

## 2022-01-25 SURGICAL SUPPLY — 63 items
ALCOHOL 70% 16 OZ (MISCELLANEOUS) ×3 IMPLANT
BAG COUNTER SPONGE SURGICOUNT (BAG) ×3 IMPLANT
BANDAGE ESMARK 6X9 LF (GAUZE/BANDAGES/DRESSINGS) IMPLANT
BLADE SURG 15 STRL LF DISP TIS (BLADE) ×4 IMPLANT
BLADE SURG 15 STRL SS (BLADE) ×3
BNDG COHESIVE 4X5 TAN STRL (GAUZE/BANDAGES/DRESSINGS) IMPLANT
BNDG COHESIVE 6X5 TAN STRL LF (GAUZE/BANDAGES/DRESSINGS) IMPLANT
BNDG ELASTIC 6X10 VLCR STRL LF (GAUZE/BANDAGES/DRESSINGS) ×3 IMPLANT
BNDG ELASTIC 6X15 VLCR STRL LF (GAUZE/BANDAGES/DRESSINGS) ×2 IMPLANT
BNDG ESMARK 6X9 LF (GAUZE/BANDAGES/DRESSINGS)
CANISTER SUCT 3000ML PPV (MISCELLANEOUS) ×3 IMPLANT
CHLORAPREP W/TINT 26 (MISCELLANEOUS) ×6 IMPLANT
COVER MAYO STAND STRL (DRAPES) ×2 IMPLANT
COVER SURGICAL LIGHT HANDLE (MISCELLANEOUS) ×3 IMPLANT
CUFF TOURN SGL QUICK 34 (TOURNIQUET CUFF) ×1
CUFF TOURN SGL QUICK 42 (TOURNIQUET CUFF) IMPLANT
CUFF TRNQT CYL 34X4.125X (TOURNIQUET CUFF) ×2 IMPLANT
DRAPE C-ARM 42X72 X-RAY (DRAPES) ×2 IMPLANT
DRAPE C-ARMOR (DRAPES) ×2 IMPLANT
DRAPE OEC MINIVIEW 54X84 (DRAPES) ×3 IMPLANT
DRAPE U-SHAPE 47X51 STRL (DRAPES) ×3 IMPLANT
DRSG MEPITEL 4X7.2 (GAUZE/BANDAGES/DRESSINGS) ×1 IMPLANT
DRSG PAD ABDOMINAL 8X10 ST (GAUZE/BANDAGES/DRESSINGS) ×2 IMPLANT
DRSG XEROFORM 1X8 (GAUZE/BANDAGES/DRESSINGS) ×3 IMPLANT
ELECT REM PT RETURN 9FT ADLT (ELECTROSURGICAL) ×3
ELECTRODE REM PT RTRN 9FT ADLT (ELECTROSURGICAL) ×2 IMPLANT
GAUZE SPONGE 4X4 12PLY STRL (GAUZE/BANDAGES/DRESSINGS) ×2 IMPLANT
GAUZE SPONGE 4X4 12PLY STRL LF (GAUZE/BANDAGES/DRESSINGS) ×1 IMPLANT
GLOVE BIOGEL M STRL SZ7.5 (GLOVE) ×3 IMPLANT
GLOVE BIOGEL PI IND STRL 8 (GLOVE) ×2 IMPLANT
GLOVE BIOGEL PI INDICATOR 8 (GLOVE) ×1
GLOVE ECLIPSE 6.0 STRL STRAW (GLOVE) ×2 IMPLANT
GLOVE SRG 8 PF TXTR STRL LF DI (GLOVE) ×2 IMPLANT
GLOVE SURG ENC TEXT LTX SZ7.5 (GLOVE) ×3 IMPLANT
GLOVE SURG UNDER POLY LF SZ8 (GLOVE) ×1
GOWN STRL REUS W/ TWL LRG LVL3 (GOWN DISPOSABLE) ×2 IMPLANT
GOWN STRL REUS W/ TWL XL LVL3 (GOWN DISPOSABLE) ×4 IMPLANT
GOWN STRL REUS W/TWL LRG LVL3 (GOWN DISPOSABLE) ×1
GOWN STRL REUS W/TWL XL LVL3 (GOWN DISPOSABLE) ×2
KIT BASIN OR (CUSTOM PROCEDURE TRAY) ×3 IMPLANT
KIT SUTURETAK 2.4 DRILL BIT (KITS) ×2 IMPLANT
KIT TURNOVER KIT B (KITS) ×3 IMPLANT
NS IRRIG 1000ML POUR BTL (IV SOLUTION) ×3 IMPLANT
PACK ORTHO EXTREMITY (CUSTOM PROCEDURE TRAY) ×3 IMPLANT
PAD ARMBOARD 7.5X6 YLW CONV (MISCELLANEOUS) ×6 IMPLANT
PAD CAST 4YDX4 CTTN HI CHSV (CAST SUPPLIES) ×2 IMPLANT
PADDING CAST COTTON 4X4 STRL (CAST SUPPLIES) ×1
PLATE SYNDESMOSIS 2H 1.5 (Plate) ×1 IMPLANT
PLATE SYNDESMOSIS 2H 1.5 STRL (Plate) ×1 IMPLANT
SPLINT PLASTER CAST XFAST 5X30 (CAST SUPPLIES) ×1 IMPLANT
SPLINT PLASTER XFAST SET 5X30 (CAST SUPPLIES) ×1
SPONGE T-LAP 18X18 ~~LOC~~+RFID (SPONGE) ×3 IMPLANT
SUCTION FRAZIER HANDLE 10FR (MISCELLANEOUS) ×1
SUCTION TUBE FRAZIER 10FR DISP (MISCELLANEOUS) ×2 IMPLANT
SUT ETHILON 3 0 PS 1 (SUTURE) ×5 IMPLANT
SUT MNCRL AB 3-0 PS2 27 (SUTURE) ×3 IMPLANT
SUT VIC AB 2-0 CT1 27 (SUTURE) ×2
SUT VIC AB 2-0 CT1 TAPERPNT 27 (SUTURE) ×4 IMPLANT
SUTURE TAPE 3.0 DBL LOAD S-TAK (Anchor) ×1 IMPLANT
SUTURETAPE 3.0 DBL LOAD S-TAK (Anchor) ×3 IMPLANT
TOWEL GREEN STERILE (TOWEL DISPOSABLE) ×3 IMPLANT
TOWEL GREEN STERILE FF (TOWEL DISPOSABLE) ×3 IMPLANT
TUBE CONNECTING 12X1/4 (SUCTIONS) ×3 IMPLANT

## 2022-01-25 NOTE — Anesthesia Procedure Notes (Signed)
Anesthesia Regional Block: Popliteal block   Pre-Anesthetic Checklist: , timeout performed,  Correct Patient, Correct Site, Correct Laterality,  Correct Procedure, Correct Position, site marked,  Risks and benefits discussed,  Surgical consent,  Pre-op evaluation,  At surgeon's request and post-op pain management  Laterality: Right  Prep: chloraprep       Needles:  Injection technique: Single-shot  Needle Type: Echogenic Needle     Needle Length: 9cm  Needle Gauge: 21     Additional Needles:   Procedures:,,,, ultrasound used (permanent image in chart),,    Narrative:  Start time: 01/25/2022 12:48 PM End time: 01/25/2022 12:53 PM Injection made incrementally with aspirations every 5 mL.  Performed by: Personally  Anesthesiologist: Collene Schlichter, MD  Additional Notes: No pain on injection. No increased resistance to injection. Injection made in 5cc increments.  Good needle visualization.  Patient tolerated procedure well.

## 2022-01-25 NOTE — Op Note (Signed)
Michelle Haynes female 61 y.o. 01/25/2022  PreOperative Diagnosis: Right ankle syndesmosis disruption Right ankle instability with deltoid ligament disruption  PostOperative Diagnosis: Same  PROCEDURE: Open reduction internal fixation of syndesmosis, right Right deltoid ligament reconstruction Right ankle stress view fluoroscopy  SURGEON: Melony Overly, MD  ASSISTANT: Jesse Martinique, PA-C was necessary for assistance with patient positioning, prep, drape, assistance with exposure and placement of hardware, closure and splinting  ANESTHESIA: General LMA anesthesia with peripheral nerve block  FINDINGS: Disrupted syndesmosis with small avulsions from the AITFL and syndesmosis instability.  Bony fragmentation of the medial malleolus consistent with deltoid ligament disruption.  Full-thickness cartilage loss of the medial talar dome  IMPLANTS: Arthrex tight rope x2 Arthrex suture tack  INDICATIONS:60 y.o. female sustained the above injury after a fall.  She had complete dislocation of the ankle joint with syndesmotic disruption.  She underwent closed reduction in the emergency department with splinting.  She was indicated for surgery given the gross instability through her ankle joint and syndesmosis.   Patient understood the risks, benefits and alternatives to surgery which include but are not limited to wound healing complications, infection, nonunion, malunion, need for further surgery as well as damage to surrounding structures. They also understood the potential for continued pain in that there were no guarantees of acceptable outcome After weighing these risks the patient opted to proceed with surgery.  PROCEDURE: Patient was identified in the preoperative holding area.  The right leg was marked by myself.  Consent was signed by myself and the patient.  Block was performed by anesthesia in the preoperative holding area.  Patient was taken to the operative suite and placed supine on  the operative table.  General LMA anesthesia anesthesia was induced without difficulty. Bump was placed under the operative hip and bone foam was used.  All bony prominences were well padded.  Tourniquet was placed on the operative thigh.  Preoperative antibiotics were given. The extremity was prepped and draped in the usual sterile fashion and surgical timeout was performed.  The limb was elevated and the tourniquet was inflated to 250 mmHg.  We began by making a longitudinal incision overlying the distal fibula.  This taken sharply down to skin subcutaneous tissue.  Blunt dissection was used to mobilize skin flaps.  Then the level of the syndesmosis was identified and the incision was carried anterior to the level of the syndesmosis.  There was disrupted ligamentous tissue and bony fragmentation off the distal aspect of the tibia and fibula where the AITFL lives.  There was gross instability of the syndesmosis.  The syndesmosis was then further mobilized and using a rondure or any interposed tissue within the syndesmosis and lateral ankle joint was removed.  The joint was irrigated.  The bony fragmentation from the ligament and structures was removed and discarded as well.  Then the syndesmosis was reduced under direct visualization held with a Weber clamp.  Then a small 2 hole plate was placed on the fibula in the appropriate position and 2 tight ropes were placed across the syndesmosis in the standard fashion.  They were tightened.  The syndesmosis was well reduced and confirmed to be adequately reduced with stress view fluoroscopy.  Stress view fluoroscopy did confirm some widening of the medial clear space and therefore decision was made to perform deltoid ligament reconstruction.  Separate incision was made overlying the medial aspect of the ankle joint.  It was taken sharply down through skin and subcutaneous tissue.  Blunt dissection was used  to mobilize skin flaps and care was taken to protect the  saphenous vein and nerve.  Then the evulsion of the superficial deltoid ligament was identified and it was elevated from the distal aspect of the fibula.  There is bony avulsion of the deep deltoid portion of the ligament that was removed.  The joint capsular tissue had been violated and therefore we able to see into the medial joint.  There was full-thickness cartilage delamination of the medial talar dome and within the medial ankle gutter.  This was documented with photography.  The delaminated cartilage fragments from the joint were removed.  The joint was irrigated.  Then a suture tack was placed within the anterior colliculus of the medial malleolus and the superficial and deep deltoid fibers were reconstructed back to the medial malleolus in a standard fashion.  This was done with ankle held in a slightly inverted position.  After reconstruction of the ligament there was no further talar tilting noted.  Final fluoroscopic images were obtained.  The wounds were then irrigated with normal saline.  Tourniquet was released.  They were closed in a layered fashion using 3-0 Monocryl and 3-0 nylon suture.  Soft dressing and short leg splint were placed.  She was awakened from anesthesia and taken recovery in stable condition.  Counts were correct.  No complications.  POST OPERATIVE INSTRUCTIONS: Patient is nonweightbearing to operative extremity Follow-up in 2 weeks for splint removal, nonweightbearing x-rays and placement of a cast.  She will be nonweightbearing 6 to 8 weeks. Patient will be admitted for observation.  She has difficulty getting into her apartment given numerous stairs.  She will be evaluated by physical therapy and assistance with getting into her house will be coordinated. She will be discharged on DVT prophylaxis  TOURNIQUET TIME: Less than 2 hours  BLOOD LOSS:  Minimal         DRAINS: none         SPECIMEN: none       COMPLICATIONS:  * No complications entered in OR log *          Disposition: PACU - hemodynamically stable.         Condition: stable

## 2022-01-25 NOTE — Anesthesia Procedure Notes (Signed)
Anesthesia Regional Block: Adductor canal block   Pre-Anesthetic Checklist: , timeout performed,  Correct Patient, Correct Site, Correct Laterality,  Correct Procedure, Correct Position, site marked,  Risks and benefits discussed,  Surgical consent,  Pre-op evaluation,  At surgeon's request and post-op pain management  Laterality: Right  Prep: chloraprep       Needles:  Injection technique: Single-shot  Needle Type: Echogenic Needle     Needle Length: 9cm  Needle Gauge: 21     Additional Needles:   Procedures:,,,, ultrasound used (permanent image in chart),,    Narrative:  Start time: 01/25/2022 12:53 PM End time: 01/25/2022 12:58 PM Injection made incrementally with aspirations every 5 mL.  Performed by: Personally  Anesthesiologist: Collene Schlichter, MD  Additional Notes: No pain on injection. No increased resistance to injection. Injection made in 5cc increments.  Good needle visualization.  Patient tolerated procedure well.

## 2022-01-25 NOTE — Anesthesia Procedure Notes (Addendum)
Procedure Name: LMA Insertion Date/Time: 01/25/2022 1:40 PM Performed by: Cathren Harsh, CRNA Pre-anesthesia Checklist: Patient identified, Emergency Drugs available, Suction available and Patient being monitored Patient Re-evaluated:Patient Re-evaluated prior to induction Oxygen Delivery Method: Circle System Utilized Preoxygenation: Pre-oxygenation with 100% oxygen Induction Type: IV induction Ventilation: Mask ventilation without difficulty LMA: LMA inserted LMA Size: 4.0 Number of attempts: 1 Airway Equipment and Method: Bite block Placement Confirmation: positive ETCO2 Tube secured with: Tape Dental Injury: Teeth and Oropharynx as per pre-operative assessment

## 2022-01-25 NOTE — H&P (Signed)
PREOPERATIVE H&P  Chief Complaint: right ankle dislocation with syndesmotic disruption.   HPI: Michelle Haynes is a 61 y.o. female who presents for preoperative history and physical with a diagnosis of right ankle fracture dislocation with syndesmotic disruption.  This happened approximately 2 weeks ago after a fall.  She is here today for surgical intervention.  She had a CT scan today which did not demonstrate any evidence of significant impaction of the tibial plafond thankfully. Symptoms are rated as moderate to severe, and have been worsening.  This is significantly impairing activities of daily living.  She has elected for surgical management.   Past Medical History:  Diagnosis Date   High cholesterol    Hypertension    Past Surgical History:  Procedure Laterality Date   AUGMENTATION MAMMAPLASTY Bilateral 2002   bilat implants behind muscle   BREAST SURGERY     Social History   Socioeconomic History   Marital status: Single    Spouse name: Not on file   Number of children: 1   Years of education: Not on file   Highest education level: Not on file  Occupational History   Not on file  Tobacco Use   Smoking status: Never    Passive exposure: Never   Smokeless tobacco: Never  Vaping Use   Vaping Use: Never used  Substance and Sexual Activity   Alcohol use: Yes    Comment: occasional   Drug use: Never   Sexual activity: Not on file  Other Topics Concern   Not on file  Social History Narrative   Not on file   Social Determinants of Health   Financial Resource Strain: Not on file  Food Insecurity: Not on file  Transportation Needs: Not on file  Physical Activity: Not on file  Stress: Not on file  Social Connections: Not on file   Family History  Problem Relation Age of Onset   Stroke Mother    Depression Mother    Diabetes Father    Anxiety disorder Father    Allergies  Allergen Reactions   Buspirone Nausea And Vomiting    from [facility]    Naltrexone-Bupropion Hcl Er Diarrhea    from [facility]   Squid Oil     squid (from [facility])   Prior to Admission medications   Medication Sig Start Date End Date Taking? Authorizing Provider  ALPRAZolam Duanne Moron) 0.5 MG tablet Take 0.5-1 tablets (0.25-0.5 mg total) by mouth daily as needed for anxiety. 01/19/21  Yes   Ascorbic Acid (VITAMIN C PO) Take 1 tablet by mouth daily.   Yes [provider]  aspirin 81 MG chewable tablet Chew 81 mg by mouth daily.   Yes [provider]  Cholecalciferol (VITAMIN D) 125 MCG (5000 UT) CAPS Take 10,000-15,000 Units by mouth daily.   Yes [provider]  COLLAGEN PO Take 6 tablets by mouth daily.   Yes [provider]  diphenhydrAMINE (BENADRYL) 25 MG tablet Take 50 mg by mouth at bedtime.   Yes [provider]  HYDROcodone-acetaminophen (NORCO/VICODIN) 5-325 MG tablet Take 1 tablet by mouth every 6 (six) hours as needed for pain. 01/13/22  Yes [provider]  ibuprofen (ADVIL) 200 MG tablet Take 400 mg by mouth every 6 (six) hours as needed for moderate pain or headache.   Yes [provider]  loperamide (IMODIUM A-D) 2 MG tablet Take 2 mg by mouth 4 (four) times daily as needed for diarrhea or loose stools.   Yes [provider]  rosuvastatin (CRESTOR) 20 MG tablet Take 1 tablet (20 mg total) by mouth at bedtime for cholesterol 05/04/21  Yes   amoxicillin (AMOXIL) 500 MG capsule Take 1 capsule (500 mg total) by mouth 3 (three) times daily for 10 days. Patient not taking: Reported on 01/18/2022 09/05/21     PARoxetine (PAXIL) 20 MG tablet TAKE 1 TABLET BY MOUTH ONCE DAILY AT BEDTIME Patient not taking: Reported on 01/18/2022 05/19/20 05/19/21  Michael Boston, MD  penicillin v potassium (VEETID) 500 MG tablet Take 1 tablet (500 mg total) by mouth 2 (two) times daily Take 1 hour before or 2 hours after a meal for 5 days Patient not taking: Reported on 01/18/2022 05/11/21     Vitamin D,  Ergocalciferol, (DRISDOL) 1.25 MG (50000 UNIT) CAPS capsule Take 1 capsule (50,000 Units total) by mouth 2 (two) times a week. Patient not taking: Reported on 01/18/2022 04/20/21   Laqueta Linden, MD     Positive ROS: All other systems have been reviewed and were otherwise negative with the exception of those mentioned in the HPI and as above.  Physical Exam:  Vitals:   01/25/22 1137  BP: 140/79  Pulse: (!) 102  Resp: 19  Temp: 97.8 F (36.6 C)  SpO2: 98%   General: Alert, no acute distress Cardiovascular: No pedal edema Respiratory: No cyanosis, no use of accessory musculature GI: No organomegaly, abdomen is soft and non-tender Skin: No lesions in the area of chief complaint Neurologic: Sensation intact distally Psychiatric: Patient is competent for consent with normal mood and affect Lymphatic: No axillary or cervical lymphadenopathy  MUSCULOSKELETAL: Right ankle in a short leg splint.  Ecchymosis present.  Swelling resolving somewhat.  No tenderness palpation of the forefoot.  No tenderness proximal to the splint.  Stable to wiggle toes.  Sensation grossly intact.  Assessment: 2 weeks out from a ankle fracture dislocation here for surgical intervention   Plan: Plan for open treatment of her syndesmosis and likely deltoid ligament reconstruction.  We will check the joint for any evidence of loose cartilage or osteochondral type lesions.  These will be treated as needed.  We discussed the risks, benefits and alternatives of surgery which include but are not limited to wound healing complications, infection, nonunion, malunion, need for further surgery, damage to surrounding structures and continued pain.  They understand there is no guarantees to an acceptable outcome.  After weighing these risks they opted to proceed with surgery.     Erle Crocker, MD    01/25/2022 1:02 PM

## 2022-01-25 NOTE — Transfer of Care (Signed)
Immediate Anesthesia Transfer of Care Note  Patient: Michelle Haynes  Procedure(s) Performed: OPEN TREATMENT OF RIGHT SYNDESMOSIS (Right: Ankle) DELTOID LIGAMENT RECONSTRCUTION (Right: Ankle)  Patient Location: PACU  Anesthesia Type:General and Regional  Level of Consciousness: awake, patient cooperative and responds to stimulation  Airway & Oxygen Therapy: Patient Spontanous Breathing  Post-op Assessment: Report given to RN and Post -op Vital signs reviewed and stable  Post vital signs: Reviewed and stable  Last Vitals:  Vitals Value Taken Time  BP 117/73 01/25/22 1515  Temp    Pulse 104 01/25/22 1515  Resp 19 01/25/22 1515  SpO2 94 % 01/25/22 1515  Vitals shown include unvalidated device data.  Last Pain:  Vitals:   01/25/22 1156  TempSrc:   PainSc: 0-No pain         Complications: No notable events documented.

## 2022-01-25 NOTE — Anesthesia Postprocedure Evaluation (Signed)
Anesthesia Post Note  Patient: Henrine Kirt  Procedure(s) Performed: OPEN TREATMENT OF RIGHT SYNDESMOSIS (Right: Ankle) DELTOID LIGAMENT RECONSTRCUTION (Right: Ankle)     Patient location during evaluation: PACU Anesthesia Type: General Level of consciousness: awake and alert Pain management: pain level controlled Vital Signs Assessment: post-procedure vital signs reviewed and stable Respiratory status: spontaneous breathing, nonlabored ventilation and respiratory function stable Cardiovascular status: blood pressure returned to baseline and stable Postop Assessment: no apparent nausea or vomiting Anesthetic complications: no   No notable events documented.  Last Vitals:  Vitals:   01/25/22 1520 01/25/22 1530  BP:  129/75  Pulse: (!) 103 100  Resp: 14 16  Temp:    SpO2: 95% 98%    Last Pain:  Vitals:   01/25/22 1530  TempSrc:   PainSc: 0-No pain                 Collene Schlichter

## 2022-01-25 NOTE — Anesthesia Preprocedure Evaluation (Signed)
Anesthesia Evaluation  Patient identified by MRN, date of birth, ID band Patient awake    Reviewed: Allergy & Precautions, NPO status , Patient's Chart, lab work & pertinent test results  Airway Mallampati: II  TM Distance: >3 FB Neck ROM: Full    Dental  (+) Teeth Intact, Dental Advisory Given   Pulmonary neg pulmonary ROS,    Pulmonary exam normal breath sounds clear to auscultation       Cardiovascular hypertension, Normal cardiovascular exam Rhythm:Regular Rate:Normal     Neuro/Psych negative neurological ROS  negative psych ROS   GI/Hepatic negative GI ROS, Neg liver ROS,   Endo/Other  Obesity   Renal/GU negative Renal ROS     Musculoskeletal RIGHT ANKLE DISLOCATION   Abdominal   Peds  Hematology negative hematology ROS (+)   Anesthesia Other Findings Day of surgery medications reviewed with the patient.  Reproductive/Obstetrics                             Anesthesia Physical Anesthesia Plan  ASA: 2  Anesthesia Plan: General   Post-op Pain Management: Regional block* and Tylenol PO (pre-op)*   Induction: Intravenous  PONV Risk Score and Plan: 3 and Midazolam, Dexamethasone and Ondansetron  Airway Management Planned: LMA  Additional Equipment:   Intra-op Plan:   Post-operative Plan: Extubation in OR  Informed Consent: I have reviewed the patients History and Physical, chart, labs and discussed the procedure including the risks, benefits and alternatives for the proposed anesthesia with the patient or authorized representative who has indicated his/her understanding and acceptance.     Dental advisory given  Plan Discussed with: CRNA  Anesthesia Plan Comments:         Anesthesia Quick Evaluation

## 2022-01-26 ENCOUNTER — Other Ambulatory Visit (HOSPITAL_COMMUNITY): Payer: Self-pay

## 2022-01-26 DIAGNOSIS — S93439A Sprain of tibiofibular ligament of unspecified ankle, initial encounter: Secondary | ICD-10-CM | POA: Diagnosis not present

## 2022-01-26 MED ORDER — ASPIRIN 325 MG PO TBEC
325.0000 mg | DELAYED_RELEASE_TABLET | Freq: Every day | ORAL | 0 refills | Status: AC
  Filled 2022-01-26: qty 30, 30d supply, fill #0

## 2022-01-26 MED ORDER — OXYCODONE HCL 5 MG PO TABS
5.0000 mg | ORAL_TABLET | ORAL | 0 refills | Status: AC | PRN
  Filled 2022-01-26: qty 30, 5d supply, fill #0

## 2022-01-26 NOTE — Discharge Instructions (Signed)

## 2022-01-26 NOTE — Progress Notes (Signed)
     Michelle Haynes is a 61 y.o. female   Orthopaedic diagnosis: Right ankle syndesmosis disruption Right ankle instability with deltoid ligament disruption  Surgery: Right ankle open treatment  and internal fixation of syndesmosis, right deltoid ligament reconstruction 01/25/2022  Subjective: Patient appears comfortable in bed.  She states nerve block remains intact, and she is not experiencing much pain.  Knee scooter at bedside.  She is hoping to go home in the next few hours, but does have numerous stairs at her home.  PT/OT has been ordered.  She would also like to discuss transportation home with case management.  She states she does not have much help to get up her stairs.  Objectyive: Vitals:   01/25/22 1942 01/26/22 0452  BP: 138/62 106/60  Pulse: (!) 105 83  Resp:  17  Temp: 98.2 F (36.8 C) 99.9 F (37.7 C)  SpO2: 95% 97%     Exam: Awake and alert Respirations even and unlabored No acute distress  Right foot and ankle shows clean, dry, and well fitted lower extremity splint.  Exposed skin is benign appearing.  Decreased sensation and inability to wiggle toes secondary to peripheral nerve block.  Range of motion at the knee is intact.  The toes are warm and well-perfused distally.  The splint was not removed.  Assessment: Postop day 1 status post the above, doing well and suitable for discharge today pending PT/OT evaluation and case management consult for transportation home.   Plan: -Nonweightbearing right lower extremity with rolling knee walker, walker, or crutches -Keep splint clean, dry, and intact -Oxycodone 5 mg tablets for outpatient pain control -Aspirin 325 once daily x1 month for DVT prophylaxis, may resume baseline 81 mg aspirin thereafter   Plan for follow-up at [facility] orthopedics with Dr. Susa Simmonds [phone] in 2 weeks for splint removal, x-rays, and suture removal if appropriate.  She will likely place into a nonweightbearing short leg cast at that  time.   Michelle Micco J. [place], PA-C

## 2022-01-26 NOTE — Progress Notes (Signed)
Physical Therapy Evaluation Patient Details Name: Pat Patrickraci Geter MRN: [id number] DOB: Sep 28, 1960 Today's Date: 01/26/2022  History of Present Illness  61 y.o. female, POD#1 Right ankle ORIF.  PMH includes:High cholesterol, Hypertension.  Clinical Impression  Pt was seen for review of skills needed to scoot up the stairs or to crawl up the stairs.  Pt declined to actually get on stairs, but demonstrated with PT the ability to crawl, to scoot and to stand at the knee walker for mobility.  Pt is maneuvering well on knee walker but had previously been using it.  Pt is getting up to side of bed from awkward angle, so discussed standing with RLE on the inside of the knee walker.  Pt is able to get up steps already, but standing up from the top of them is her issue.  Agreed to scoot to another set and scoot up two so she has a higher surface to safely stand.  Follow along with her as her stay permits, and recommend HHPT for further strengthening to get back control of shoulders and LLE.         Recommendations for follow up therapy are one component of a multi-disciplinary discharge planning process, led by the attending physician.  Recommendations may be updated based on patient status, additional functional criteria and insurance authorization.  Follow Up Recommendations Home health PT    Assistance Recommended at Discharge Set up Supervision/Assistance  Patient can return home with the following  A little help with walking and/or transfers;Assistance with cooking/housework;Help with stairs or ramp for entrance;Assist for transportation    Equipment Recommendations None recommended by PT  Recommendations for Other Services       Functional Status Assessment Patient has had a recent decline in their functional status and demonstrates the ability to make significant improvements in function in a reasonable and predictable amount of time.     Precautions / Restrictions Precautions Precautions:  Fall Precaution Comments: recent fall injuring R ankle syndesmosis Required Braces or Orthoses: Splint/Cast Splint/Cast: R leg Splint/Cast - Date Prophylactic Dressing Applied (if applicable): 01/25/22 Restrictions Weight Bearing Restrictions: Yes RLE Weight Bearing: Non weight bearing      Mobility  Bed Mobility Overal bed mobility: Modified Independent                  Transfers Overall transfer level: Modified independent Equipment used:  (knee scooter)               General transfer comment: knee scooter    Ambulation/Gait Ambulation/Gait assistance: Supervision Gait Distance (Feet): 60 Feet Assistive device:  (knee walker)   Gait velocity: reduced Gait velocity interpretation: <1.31 ft/sec, indicative of household ambulator Pre-gait activities: standing on knee walker with observed safety of use of device General Gait Details: pt demonstrated confined space mobility on knee walker, with minimal cues needed for backup and redirecting  Stairs            Wheelchair Mobility    Modified Rankin (Stroke Patients Only)       Balance Overall balance assessment: Needs assistance Sitting-balance support: Single extremity supported Sitting balance-Leahy Scale: Normal     Standing balance support: Bilateral upper extremity supported Standing balance-Leahy Scale: Fair Standing balance comment: able to steady herself with one hand on stable surface or with knee walker                             Pertinent Vitals/Pain Pain Assessment Pain  Assessment: Faces Faces Pain Scale: Hurts little more Pain Location: R ankle    Home Living Family/patient expects to be discharged to:: Private residence Living Arrangements: Alone Available Help at Discharge: Family;Available 24 hours/day Type of Home: Apartment Home Access: Stairs to enter Entrance Stairs-Rails: Doctor, general practice of Steps: 20   Home Layout: One level Home  Equipment: Other (comment) Additional Comments: Knee scooter    Prior Function Prior Level of Function : Independent/Modified Independent             Mobility Comments: pt was walking on R ankle presurgery       Hand Dominance   Dominant Hand: Right    Extremity/Trunk Assessment   Upper Extremity Assessment Upper Extremity Assessment: Defer to OT evaluation    Lower Extremity Assessment Lower Extremity Assessment: RLE deficits/detail RLE Deficits / Details: R ankle is NWB but moving R knee functionally RLE Coordination: decreased gross motor    Cervical / Trunk Assessment Cervical / Trunk Assessment: Normal  Communication   Communication: No difficulties  Cognition Arousal/Alertness: Awake/alert Behavior During Therapy: WFL for tasks assessed/performed Overall Cognitive Status: Within Functional Limits for tasks assessed                                          General Comments General comments (skin integrity, edema, etc.): pt is up to stand with no assistance, has been home with minor help.  Pt is demonstrating abiltiy to maneuver but is unable to commit the energy to trying steps directly.  Agreed to work on the skills to scoot up steps with success    Exercises     Assessment/Plan    PT Assessment Patient needs continued PT services  PT Problem List Decreased activity tolerance;Decreased balance;Decreased mobility;Decreased coordination;Decreased knowledge of use of DME;Decreased safety awareness;Pain       PT Treatment Interventions DME instruction;Gait training;Stair training;Functional mobility training;Therapeutic activities;Therapeutic exercise;Balance training;Neuromuscular re-education;Patient/family education    PT Goals (Current goals can be found in the Care Plan section)  Acute Rehab PT Goals Patient Stated Goal: to go home without actually getting on the stairs PT Goal Formulation: With patient Time For Goal Achievement:  02/01/22 Potential to Achieve Goals: Good    Frequency Min 5X/week     Co-evaluation               AM-PAC PT "6 Clicks" Mobility  Outcome Measure Help needed turning from your back to your side while in a flat bed without using bedrails?: None Help needed moving from lying on your back to sitting on the side of a flat bed without using bedrails?: None Help needed moving to and from a bed to a chair (including a wheelchair)?: A Little Help needed standing up from a chair using your arms (e.g., wheelchair or bedside chair)?: A Little Help needed to walk in hospital room?: A Little Help needed climbing 3-5 steps with a railing? : A Lot 6 Click Score: 19    End of Session Equipment Utilized During Treatment: Gait belt Activity Tolerance: Patient limited by fatigue;Treatment limited secondary to medical complications (Comment) (NWB on RLE) Patient left: in bed;with call bell/phone within reach;with family/visitor present Nurse Communication: Mobility status PT Visit Diagnosis: History of falling (Z91.81)    Time: 7517-0017 PT Time Calculation (min) (ACUTE ONLY): 24 min   Charges:   PT Evaluation $PT Eval Moderate Complexity: 1 Mod PT  Treatments $Gait Training: 8-22 mins       Ivar Drape 01/26/2022, 1:19 PM  Samul Dada, PT PhD Acute Rehab Dept. Number: [facility] [id number] and [facility] [phone]

## 2022-01-26 NOTE — Progress Notes (Signed)
Discharge instructions given to pt. Pt verbalized understanding of all teaching. Patient discharged to home with sister via wheelchair by volunteer services.

## 2022-01-26 NOTE — Evaluation (Signed)
Occupational Therapy Evaluation Patient Details Name: Michelle Haynes MRN: [id number] DOB: July 12, 1961 Today's Date: 01/26/2022   History of Present Illness 61 y.o. female, POD#1 Right ankle ORIF.  PMH includes:High cholesterol, Hypertension.   Clinical Impression   Patient admitted for the diagnosis and procedure above.  PTA she works full time, and needed no assist with any aspect of mobility, ADL or IADL.  Currently her NWB status to her R lower extremity is her biggest deficit.  Unfortunately, she has 20 stairs she needs to navigate in order to enter her apartment.  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  OT would recommend medical transport to home, with the needed physical assist to ascend the 20 and reach her apartment safely.  Once in the apartment, she will have supportive assist from her sister, and should not need any post acute OT.       Recommendations for follow up therapy are one component of a multi-disciplinary discharge planning process, led by the attending physician.  Recommendations may be updated based on patient status, additional functional criteria and insurance authorization.   Follow Up Recommendations  No OT follow up    Assistance Recommended at Discharge Intermittent Supervision/Assistance  Patient can return home with the following A lot of help with walking and/or transfers;A little help with bathing/dressing/bathroom;Assistance with cooking/housework;Help with stairs or ramp for entrance;Assist for transportation    Functional Status Assessment  Patient has had a recent decline in their functional status and demonstrates the ability to make significant improvements in function in  a reasonable and predictable amount of time.  Equipment Recommendations  None recommended by OT    Recommendations for Other Services       Precautions / Restrictions Precautions Precautions: Fall Required Braces or Orthoses: Splint/Cast Splint/Cast: R leg Splint/Cast - Date Prophylactic Dressing Applied (if applicable): 01/25/22 Restrictions Weight Bearing Restrictions: Yes RLE Weight Bearing: Non weight bearing      Mobility Bed Mobility Overal bed mobility: Modified Independent                  Transfers Overall transfer level: Modified independent Equipment used: None               General transfer comment: knee scooter      Balance Overall balance assessment: Needs assistance Sitting-balance support: Feet supported Sitting balance-Leahy Scale: Normal     Standing balance support: Single extremity supported Standing balance-Leahy Scale: Fair                             ADL either performed or assessed with clinical judgement   ADL Overall ADL's : At baseline                                       General ADL Comments: Generalized supervision     Vision Baseline Vision/History: 1 Wears glasses Patient Visual Report: No change from baseline       Perception Perception Perception: Not tested   Praxis Praxis Praxis: Not tested  Pertinent Vitals/Pain Pain Assessment Pain Assessment: No/denies pain     Hand Dominance Right   Extremity/Trunk Assessment Upper Extremity Assessment Upper Extremity Assessment: Overall WFL for tasks assessed   Lower Extremity Assessment Lower Extremity Assessment: Defer to PT evaluation   Cervical / Trunk Assessment Cervical / Trunk Assessment: Normal   Communication Communication Communication: No difficulties   Cognition Arousal/Alertness: Awake/alert Behavior During Therapy: WFL for tasks assessed/performed Overall Cognitive Status: Within Functional Limits for  tasks assessed                                       General Comments   VSS on RA    Exercises     Shoulder Instructions      Home Living Family/patient expects to be discharged to:: Private residence Living Arrangements: Alone Available Help at Discharge: Family;Available 24 hours/day Type of Home: Apartment Home Access: Stairs to enter Entrance Stairs-Number of Steps: 20 Entrance Stairs-Rails: Right;Left Home Layout: One level     Bathroom Shower/Tub: Chief Strategy Officer: Standard Bathroom Accessibility: Yes How Accessible: Accessible via walker Home Equipment: Other (comment)   Additional Comments: Knee scooter      Prior Functioning/Environment Prior Level of Function : Independent/Modified Independent                        OT Problem List: Decreased activity tolerance;Impaired balance (sitting and/or standing)      OT Treatment/Interventions:      OT Goals(Current goals can be found in the care plan section) Acute Rehab OT Goals Patient Stated Goal: Return home today OT Goal Formulation: With patient Time For Goal Achievement: 01/31/22 Potential to Achieve Goals: Good  OT Frequency:      Co-evaluation              AM-PAC OT "6 Clicks" Daily Activity     Outcome Measure Help from another person eating meals?: None Help from another person taking care of personal grooming?: None Help from another person toileting, which includes using toliet, bedpan, or urinal?: A Little Help from another person bathing (including washing, rinsing, drying)?: A Little Help from another person to put on and taking off regular upper body clothing?: None Help from another person to put on and taking off regular lower body clothing?: A Little 6 Click Score: 21   End of Session Equipment Utilized During Treatment: Other (comment) (knee scooter) Nurse Communication: Mobility status  Activity Tolerance: Patient tolerated treatment  well Patient left: in bed;with call bell/phone within reach;with family/visitor present  OT Visit Diagnosis: Unsteadiness on feet (R26.81)                Time: 4098-1191 OT Time Calculation (min): 20 min Charges:  OT General Charges $OT Visit: 1 Visit OT Evaluation $OT Eval Moderate Complexity: 1 Mod  01/26/2022  RP, OTR/L  Acute Rehabilitation Services  Office:  [phone]   Suzanna Obey 01/26/2022, 8:50 AM

## 2022-01-26 NOTE — Progress Notes (Signed)
Patient refused morning meds. Ready to be discharged home.

## 2022-01-26 NOTE — Discharge Summary (Signed)
Patient ID: Michelle Haynes MRN: [id number] DOB/AGE: [birth year] 59 y.o.  Admit date: 01/25/2022 Discharge date: 01/26/2022  Admission Diagnoses:  Principal Problem:   Closed dislocation of right ankle   Discharge Diagnoses:  Same  Past Medical History:  Diagnosis Date   High cholesterol    Hypertension     Surgeries: Procedure(s): OPEN TREATMENT OF RIGHT SYNDESMOSIS DELTOID LIGAMENT RECONSTRCUTION on 01/25/2022   Consultants:   Discharged Condition: Improved  Hospital Course: Michelle Patrickraci Joynt is an 61 y.o. female who was admitted 01/25/2022 for operative treatment ofClosed dislocation of right ankle. Patient has severe unremitting pain that affects sleep, daily activities, and work/hobbies. After pre-op clearance the patient was taken to the operating room on 01/25/2022 and underwent  Procedure(s): OPEN TREATMENT OF RIGHT SYNDESMOSIS DELTOID LIGAMENT RECONSTRCUTION.    Patient was given perioperative antibiotics:  Anti-infectives (From admission, onward)    Start     Dose/Rate Route Frequency Ordered Stop   01/25/22 2100  ceFAZolin (ANCEF) IVPB 2g/100 mL premix        2 g 200 mL/hr over 30 Minutes Intravenous Every 6 hours 01/25/22 1746 01/26/22 1459   01/25/22 1145  ceFAZolin (ANCEF) IVPB 2g/100 mL premix        2 g 200 mL/hr over 30 Minutes Intravenous On call to O.R. 01/25/22 1135 01/25/22 1344        Patient was given sequential compression devices, early ambulation, and chemoprophylaxis to prevent DVT.  Patient benefited maximally from hospital stay and there were no complications.    Recent vital signs: Patient Vitals for the past 24 hrs:  BP Temp Temp src Pulse Resp SpO2  01/26/22 0822 (!) 144/83 98.7 F (37.1 C) Oral 83 17 100 %  01/26/22 0452 106/60 99.9 F (37.7 C) Oral 83 17 97 %  01/25/22 1942 138/62 98.2 F (36.8 C) Oral (!) 105 -- 95 %  01/25/22 1941 138/62 98.2 F (36.8 C) Oral (!) 105 17 96 %  01/25/22 1735 121/72 98.2 F (36.8 C) Oral 96 16 96 %   01/25/22 1715 119/67 97.7 F (36.5 C) -- 99 20 94 %  01/25/22 1700 125/75 -- -- (!) 102 14 94 %  01/25/22 1645 132/80 -- -- 99 16 93 %  01/25/22 1630 126/73 -- -- 99 17 96 %  01/25/22 1615 126/73 -- -- (!) 105 18 96 %  01/25/22 1600 (!) 153/77 -- -- (!) 103 16 96 %  01/25/22 1545 131/77 -- -- (!) 111 17 97 %  01/25/22 1530 129/75 -- -- 100 16 98 %  01/25/22 1520 -- -- -- (!) 103 14 95 %  01/25/22 1515 117/73 97.7 F (36.5 C) -- (!) 104 19 94 %     Recent laboratory studies:  Recent Labs    01/23/22 1530  WBC 9.9  HGB 14.0  HCT 43.0  PLT 431*  NA 139  K 4.3  CL 104  CO2 24  BUN 15  CREATININE 0.81  GLUCOSE 97  CALCIUM 10.4*     Discharge Medications:   Allergies as of 01/26/2022       Reactions   Buspirone Nausea And Vomiting   from [facility]   Naltrexone-bupropion Hcl Er Diarrhea   from [facility]   Squid Oil    squid (from [facility])        Medication List     STOP taking these medications    amoxicillin 500 MG capsule Commonly known as: AMOXIL   aspirin 81 MG chewable tablet Replaced by: aspirin  EC 325 MG tablet   HYDROcodone-acetaminophen 5-325 MG tablet Commonly known as: NORCO/VICODIN   PARoxetine 20 MG tablet Commonly known as: PAXIL   penicillin v potassium 500 MG tablet Commonly known as: VEETID   Vitamin D (Ergocalciferol) 1.25 MG (50000 UNIT) Caps capsule Commonly known as: DRISDOL       TAKE these medications    ALPRAZolam 0.5 MG tablet Commonly known as: XANAX Take 0.5-1 tablets (0.25-0.5 mg total) by mouth daily as needed for anxiety.   aspirin EC 325 MG tablet Take 1 tablet (325 mg total) by mouth daily. Replaces: aspirin 81 MG chewable tablet   COLLAGEN PO Take 6 tablets by mouth daily.   diphenhydrAMINE 25 MG tablet Commonly known as: BENADRYL Take 50 mg by mouth at bedtime.   ibuprofen 200 MG tablet Commonly known as: ADVIL Take 400 mg by mouth every 6 (six) hours as needed for moderate pain or  headache.   loperamide 2 MG tablet Commonly known as: IMODIUM A-D Take 2 mg by mouth 4 (four) times daily as needed for diarrhea or loose stools.   oxyCODONE 5 MG immediate release tablet Commonly known as: Roxicodone Take 1 tablet (5 mg total) by mouth every 4 (four) hours as needed for up to 5 days for severe pain.   rosuvastatin 20 MG tablet Commonly known as: CRESTOR Take 1 tablet (20 mg total) by mouth at bedtime for cholesterol   VITAMIN C PO Take 1 tablet by mouth daily.   Vitamin D 125 MCG (5000 UT) Caps Take 10,000-15,000 Units by mouth daily.               Durable Medical Equipment  (From admission, onward)           Start     Ordered   01/25/22 1747  DME Walker rolling  Once       Question Answer Comment  Walker: With 5 Inch Wheels   Patient needs a walker to treat with the following condition Closed dislocation of right ankle      01/25/22 1746              Discharge Care Instructions  (From admission, onward)           Start     Ordered   01/26/22 0000  Non weight bearing       Question Answer Comment  Laterality right   Extremity Lower      01/26/22 0740   01/26/22 0000  Non weight bearing       Question Answer Comment  Laterality right   Extremity Lower      01/26/22 1214            Diagnostic Studies: DG Ankle Complete Right  Result Date: 01/25/2022 CLINICAL DATA:  Status post ORIF of ankle fracture. EXAM: RIGHT ANKLE - COMPLETE 3+ VIEW COMPARISON:  CT 01/25/2022 FINDINGS: Status post open reduction and internal fixation of the distal tibiofibular syndesmosis. Hardware components and osseous structures appear to be in anatomic alignment. No complications noted. IMPRESSION: Status post ORIF of distal tibiofibular syndesmosis. Electronically Signed   By: Kerby Moors M.D.   On: 01/25/2022 14:51   CT ANKLE RIGHT WO CONTRAST  Result Date: 01/25/2022 CLINICAL DATA:  Ankle trauma, dislocation/ligament injury suspected (Age  >= 5y) EXAM: CT OF THE RIGHT ANKLE WITHOUT CONTRAST TECHNIQUE: Multidetector CT imaging of the right ankle was performed according to the standard protocol. Multiplanar CT image reconstructions were also generated. RADIATION DOSE REDUCTION: This  exam was performed according to the departmental dose-optimization program which includes automated exposure control, adjustment of the mA and/or kV according to patient size and/or use of iterative reconstruction technique. COMPARISON:  None Available. FINDINGS: Bones/Joint/Cartilage Mildly displaced medial malleolar avulsion fracture. Displaced anterior tibiofibular ligament avulsion off of the distal fibula. There is a mild displaced posterior malleolar fracture involving up to 15% of the articular surface. There is also a focal posterior tibial lip fracture more laterally at the attachment of the posterior tibiofibular ligament (series 3, image 88). There are multiple tiny bony fragments along the ankle joint. Preserved talar dome and subtalar joints. No other fracture identified in the visualized midfoot. Widening of the medial joint space. 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 Probable os peroneum. Ligaments Suboptimally assessed by CT. Muscles and Tendons No evidence of tendon entrapment. Soft tissues Soft tissue swelling of the foot and ankle. No focal fluid collection. IMPRESSION: Mildly displaced medial malleolar fracture, displaced anterior tibiofibular ligament avulsion fracture off the distal fibula, and mildly displaced posterior malleolar fracture involving up to 50% of the articular surface. Additionally, there is a focal posterior tibial lip fracture more laterally at the attachment of the posterior tibiofibular ligament. Multiple tiny bony fragments are present along the ankle joint. Widening of the medial joint space suggestive of instability. If not already acquired, recommend  tibia and fibula radiographs to rule out a proximal fibular fracture. Electronically Signed   By: Maurine Simmering M.D.   On: 01/25/2022 10:31   DG C-Arm 1-60 Min-No Report  Result Date: 01/25/2022 Fluoroscopy was utilized by the requesting physician.  No radiographic interpretation.    Disposition: Discharge disposition: 01-Home or Self Care       Discharge Instructions     Call MD / Call 911   Complete by: As directed    If you experience chest pain or shortness of breath, CALL 911 and be transported to the hospital emergency room.  If you develope a fever above 101 F, pus (white drainage) or increased drainage or redness at the wound, or calf pain, call your surgeon's office.   Call MD / Call 911   Complete by: As directed    If you experience chest pain or shortness of breath, CALL 911 and be transported to the hospital emergency room.  If you develope a fever above 101 F, pus (white drainage) or increased drainage or redness at the wound, or calf pain, call your surgeon's office.   Constipation Prevention   Complete by: As directed    Drink plenty of fluids.  Prune juice may be helpful.  You may use a stool softener, such as Colace (over the counter) 100 mg twice a day.  Use MiraLax (over the counter) for constipation as needed.   Constipation Prevention   Complete by: As directed    Drink plenty of fluids.  Prune juice may be helpful.  You may use a stool softener, such as Colace (over the counter) 100 mg twice a day.  Use MiraLax (over the counter) for constipation as needed.   Diet - low sodium heart healthy   Complete by: As directed    Non weight bearing   Complete by: As directed    Laterality: right   Extremity: Lower   Non weight bearing   Complete by: As directed    Laterality: right   Extremity: Lower   Post-operative opioid taper instructions:   Complete by: As directed  POST-OPERATIVE OPIOID TAPER INSTRUCTIONS: It is important to wean off of your opioid  medication as soon as possible. If you do not need pain medication after your surgery it is ok to stop day one. Opioids include: Codeine, Hydrocodone(Norco, Vicodin), Oxycodone(Percocet, oxycontin) and hydromorphone amongst others.  Long term and even short term use of opiods can cause: Increased pain response Dependence Constipation Depression Respiratory depression And more.  Withdrawal symptoms can include Flu like symptoms Nausea, vomiting And more Techniques to manage these symptoms Hydrate well Eat regular healthy meals Stay active Use relaxation techniques(deep breathing, meditating, yoga) Do Not substitute Alcohol to help with tapering If you have been on opioids for less than two weeks and do not have pain than it is ok to stop all together.  Plan to wean off of opioids This plan should start within one week post op of your joint replacement. Maintain the same interval or time between taking each dose and first decrease the dose.  Cut the total daily intake of opioids by one tablet each day Next start to increase the time between doses. The last dose that should be eliminated is the evening dose.      Post-operative opioid taper instructions:   Complete by: As directed    POST-OPERATIVE OPIOID TAPER INSTRUCTIONS: It is important to wean off of your opioid medication as soon as possible. If you do not need pain medication after your surgery it is ok to stop day one. Opioids include: Codeine, Hydrocodone(Norco, Vicodin), Oxycodone(Percocet, oxycontin) and hydromorphone amongst others.  Long term and even short term use of opiods can cause: Increased pain response Dependence Constipation Depression Respiratory depression And more.  Withdrawal symptoms can include Flu like symptoms Nausea, vomiting And more Techniques to manage these symptoms Hydrate well Eat regular healthy meals Stay active Use relaxation techniques(deep breathing, meditating, yoga) Do Not  substitute Alcohol to help with tapering If you have been on opioids for less than two weeks and do not have pain than it is ok to stop all together.  Plan to wean off of opioids This plan should start within one week post op of your joint replacement. Maintain the same interval or time between taking each dose and first decrease the dose.  Cut the total daily intake of opioids by one tablet each day Next start to increase the time between doses. The last dose that should be eliminated is the evening dose.           Follow-up Information     Erle Crocker, MD Follow up in 2 week(s).   Specialty: Orthopedic Surgery Contact information: [place] [address] [phone]                -Nonweightbearing right lower extremity with rolling knee walker, walker, or crutches -Keep splint clean, dry, and intact -Oxycodone 5 mg tablets for outpatient pain control -Aspirin 325 once daily x1 month for DVT prophylaxis, may resume baseline 81 mg aspirin thereafter     Plan for follow-up at [place] with Dr. Lucia Gaskins [phone] in 2 weeks for splint removal, x-rays, and suture removal if appropriate.  She will likely place into a nonweightbearing short leg cast at that time.  Signed: Sakai Heinle J [place] 01/26/2022, 12:58 PM

## 2022-01-26 NOTE — TOC Initial Note (Signed)
Transition of Care [facility]) - Initial/Assessment Note    Patient Details  Name: Michelle Haynes MRN: [id number] Date of Birth: [birth year]  Transition of Care [facility]) CM/SW Contact:    Kingsley Plan, RN Phone Number: 01/26/2022, 10:30 AM  Clinical Narrative:                 Spoke to patient and family member at bedside before 0900 am this am. Patient wanting to discharge before 1000 am , states was told by insurance that she is 23 OP in bed and she arrived yesterday at 1100 am. NCM called [facility] CM normally insurance goes by midnight the day of discharge but suggested patient call her INS benefit line. Provided that information to patient.   Patient has knee scooter at bedside and does not want walker that was ordered.   Called Cindie with [facility] she is running insurance to verify patient has home health benefit with her plan . Cindie started process around 0900 am , and is waiting to hear back from insurance.   1020 NCM returned to room to explained still waiting to hear from insurance regarding home health . Patient states she does not need home health now, until a couple of months when she can go to orthopedic office for OP PT.   Patient does not want ambulance transportation home unless it is reproved. NCM called CM insurance and was told ambulance company will need to submit bill after service.   Patient aware , so does not want ambulance.   NCM messaged  [place] PA, OT,PT and RN   Expected Discharge Plan: Home w Home Health Services     Patient Goals and CMS Choice Patient states their goals for this hospitalization and ongoing recovery are:: go home      Expected Discharge Plan and Services Expected Discharge Plan: Home w Home Health Services     Post Acute Care Choice: Home Health   Expected Discharge Date: 01/26/22                 DME Agency: NA       HH Arranged: PT, OT HH Agency: [facility] Date [facility] Agency Contacted: 01/26/22 Time HH Agency  Contacted: 0900 Representative spoke with at [facility] Agency: Cindie checking on insurance  Prior Living Arrangements/Services       Do you feel safe going back to the place where you live?: Yes          Current home services: DME    Activities of Daily Living      Permission Sought/Granted                  Emotional Assessment              Admission diagnosis:  Closed dislocation of right ankle [S93.04XA] Patient Active Problem List   Diagnosis Date Noted   Closed dislocation of right ankle 01/25/2022   PCP:  Melida Quitter, MD Pharmacy:   [facility] [phone] - [place], [place] - [address] AT [facility] [address] [place] [address] Phone: [phone] Fax: [phone]  [id number] [place] [facility] - [address] [place] [address] Phone: [phone] Fax: [phone]  [facility] [address] [place] [address] Phone: [phone] Fax: [phone]     Social Determinants of Health (SDOH) Interventions    Readmission Risk Interventions     View : No data  to display.

## 2022-01-31 ENCOUNTER — Encounter (HOSPITAL_COMMUNITY): Payer: Self-pay | Admitting: Orthopaedic Surgery

## 2022-02-08 ENCOUNTER — Other Ambulatory Visit (HOSPITAL_COMMUNITY): Payer: Self-pay

## 2022-02-08 MED ORDER — CYCLOBENZAPRINE HCL 10 MG PO TABS
5.0000 mg | ORAL_TABLET | Freq: Three times a day (TID) | ORAL | 0 refills | Status: AC | PRN
  Filled 2022-02-08: qty 30, 10d supply, fill #0

## 2022-02-12 ENCOUNTER — Ambulatory Visit: Payer: No Typology Code available for payment source

## 2022-02-19 ENCOUNTER — Other Ambulatory Visit (HOSPITAL_COMMUNITY): Payer: Self-pay

## 2022-02-22 ENCOUNTER — Other Ambulatory Visit (HOSPITAL_COMMUNITY): Payer: Self-pay

## 2022-04-11 ENCOUNTER — Ambulatory Visit: Payer: No Typology Code available for payment source

## 2022-04-11 ENCOUNTER — Other Ambulatory Visit (HOSPITAL_COMMUNITY): Payer: Self-pay

## 2022-04-11 ENCOUNTER — Encounter (INDEPENDENT_AMBULATORY_CARE_PROVIDER_SITE_OTHER): Payer: Self-pay

## 2022-04-11 MED ORDER — CLOBETASOL PROPIONATE 0.05 % EX OINT
1.0000 | TOPICAL_OINTMENT | Freq: Two times a day (BID) | CUTANEOUS | 0 refills | Status: AC
  Filled 2022-04-11: qty 60, 30d supply, fill #0

## 2022-04-24 ENCOUNTER — Ambulatory Visit
Admission: RE | Admit: 2022-04-24 | Discharge: 2022-04-24 | Disposition: A | Discharge status: Home / Self Care | Payer: No Typology Code available for payment source | Source: Ambulatory Visit | Attending: Internal Medicine | Admitting: Internal Medicine

## 2022-04-24 DIAGNOSIS — Z1231 Encounter for screening mammogram for malignant neoplasm of breast: Secondary | ICD-10-CM

## 2022-05-29 ENCOUNTER — Other Ambulatory Visit (HOSPITAL_COMMUNITY): Payer: Self-pay

## 2022-05-29 MED ORDER — MELOXICAM 15 MG PO TABS
15.0000 mg | ORAL_TABLET | Freq: Every day | ORAL | 1 refills | Status: AC
  Filled 2022-05-29: qty 30, 30d supply, fill #0
  Filled 2022-07-04: qty 30, 30d supply, fill #1

## 2022-06-08 ENCOUNTER — Other Ambulatory Visit (HOSPITAL_COMMUNITY): Payer: Self-pay

## 2022-06-08 MED ORDER — ALPRAZOLAM 0.5 MG PO TABS
0.2500 mg | ORAL_TABLET | Freq: Every day | ORAL | 1 refills | Status: DC | PRN
  Filled 2022-06-08: qty 20, 20d supply, fill #0
  Filled 2022-10-04: qty 20, 20d supply, fill #1

## 2022-06-08 MED ORDER — ROSUVASTATIN CALCIUM 20 MG PO TABS
20.0000 mg | ORAL_TABLET | Freq: Every evening | ORAL | 3 refills | Status: DC | PRN
Start: 2022-06-08 — End: 2023-07-19
  Filled 2022-06-08: qty 90, 90d supply, fill #0
  Filled 2022-09-14: qty 90, 90d supply, fill #1
  Filled 2022-12-24: qty 90, 90d supply, fill #2
  Filled 2023-04-03: qty 90, 90d supply, fill #3

## 2022-07-04 ENCOUNTER — Other Ambulatory Visit (HOSPITAL_COMMUNITY): Payer: Self-pay

## 2022-07-30 ENCOUNTER — Ambulatory Visit: Payer: No Typology Code available for payment source | Admitting: Psychiatry

## 2022-08-01 ENCOUNTER — Ambulatory Visit (INDEPENDENT_AMBULATORY_CARE_PROVIDER_SITE_OTHER): Payer: No Typology Code available for payment source | Admitting: Psychiatry

## 2022-08-01 DIAGNOSIS — R69 Illness, unspecified: Secondary | ICD-10-CM

## 2022-08-01 DIAGNOSIS — F4321 Adjustment disorder with depressed mood: Secondary | ICD-10-CM | POA: Diagnosis not present
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
## 2022-08-14 ENCOUNTER — Other Ambulatory Visit (HOSPITAL_COMMUNITY): Payer: Self-pay

## 2022-08-14 MED ORDER — ROSUVASTATIN CALCIUM 20 MG PO TABS
20.0000 mg | ORAL_TABLET | Freq: Every day | ORAL | 3 refills | Status: DC
  Filled 2022-08-14: qty 90, 90d supply, fill #0

## 2022-08-14 MED ORDER — TRIAMCINOLONE ACETONIDE 0.1 % EX CREA
1.0000 | TOPICAL_CREAM | Freq: Two times a day (BID) | CUTANEOUS | 0 refills | Status: AC
  Filled 2022-08-14: qty 30, 30d supply, fill #0

## 2022-08-16 ENCOUNTER — Other Ambulatory Visit (HOSPITAL_COMMUNITY): Payer: Self-pay

## 2022-08-16 ENCOUNTER — Other Ambulatory Visit: Payer: Self-pay

## 2022-08-16 MED ORDER — SEMAGLUTIDE-WEIGHT MANAGEMENT 0.25 MG/0.5ML ~~LOC~~ SOAJ: 0.2500 mg | SUBCUTANEOUS | 0 refills | Status: AC

## 2022-08-16 MED ORDER — SEMAGLUTIDE-WEIGHT MANAGEMENT 0.25 MG/0.5ML ~~LOC~~ SOAJ
0.2500 mg | SUBCUTANEOUS | 0 refills | Status: AC
Start: 2022-08-16 — End: ?
  Filled 2022-08-16: qty 2, 28d supply, fill #0

## 2022-08-16 MED ORDER — SEMAGLUTIDE-WEIGHT MANAGEMENT 0.25 MG/0.5ML ~~LOC~~ SOAJ
SUBCUTANEOUS | 0 refills | Status: AC
  Filled 2022-08-16: qty 2, 28d supply, fill #0

## 2022-08-20 ENCOUNTER — Other Ambulatory Visit (HOSPITAL_COMMUNITY): Payer: Self-pay

## 2022-08-23 ENCOUNTER — Other Ambulatory Visit: Payer: Self-pay

## 2022-08-24 ENCOUNTER — Other Ambulatory Visit: Payer: Self-pay

## 2022-08-28 ENCOUNTER — Other Ambulatory Visit: Payer: Self-pay

## 2022-09-07 ENCOUNTER — Other Ambulatory Visit (HOSPITAL_COMMUNITY): Payer: Self-pay

## 2022-09-18 DIAGNOSIS — E6609 Other obesity due to excess calories: Secondary | ICD-10-CM | POA: Diagnosis not present

## 2022-09-18 DIAGNOSIS — I10 Essential (primary) hypertension: Secondary | ICD-10-CM | POA: Diagnosis not present

## 2022-09-18 DIAGNOSIS — Z6833 Body mass index (BMI) 33.0-33.9, adult: Secondary | ICD-10-CM | POA: Diagnosis not present

## 2022-09-18 DIAGNOSIS — E782 Mixed hyperlipidemia: Secondary | ICD-10-CM | POA: Diagnosis not present

## 2022-09-18 DIAGNOSIS — F5102 Adjustment insomnia: Secondary | ICD-10-CM | POA: Diagnosis not present

## 2022-09-27 ENCOUNTER — Ambulatory Visit (INDEPENDENT_AMBULATORY_CARE_PROVIDER_SITE_OTHER): Payer: 59 | Admitting: Psychiatry

## 2022-09-27 DIAGNOSIS — R69 Illness, unspecified: Secondary | ICD-10-CM

## 2022-09-27 DIAGNOSIS — F4321 Adjustment disorder with depressed mood: Secondary | ICD-10-CM | POA: Diagnosis not present

## 2022-09-27 DIAGNOSIS — F5089 Other specified eating disorder: Secondary | ICD-10-CM

## 2022-09-27 NOTE — Progress Notes (Signed)
Psychotherapy Progress Note [facility] Luan Moore, PhD LP  Patient ID: Melvin Primeaux)    MRN: [id number] Therapy format: Individual psychotherapy Date: 09/27/2022      Start: 4:11p     Stop: 5:00p     Time Spent: 49 min Location: In-person   Session narrative (presenting needs, interim history, self-report of stressors and symptoms, applications of prior therapy, status changes, and interventions made in session) Xanax before she came in, having digestive issue flare up.  20 years of it now, stabilizing some on fiber.  Does have near-daily worry and restlessness.  Sees priority needs to focus on finding a church and making her own home and activities here, having been here a few years now without putting down roots.  Has identified [employer] as a church to try out, plans to check it out this Sunday, though she knows she will feel some resistance when the day comes, and she also has an obligation to receive a new renter on Sunday and possibility time will be too tight to do both.  Affirmed and encouraged, reframed as no-lose opportunity -- either she can negotiate for time or get something important done and mentally practice going next time.    Has started [facility] shots now through the [facility] clinic, some initial seeing some initial weight loss.  Currently going up to 0.32m tonight.  Clear she wants to lose at a pace, not risk outpacing her body, so she does not expect to titrate up as fast as normally recommended.    Main interests, then, in establishing friendships/community and engaging fitness.  Wants a trainer but hard to find, not that available in the very affordable [facility] gym.  Discussed ways to overcome resistance, primarily by making ludicrously small commitments, like 10 steps out the door, or look but don't go in at a facility.  Therapeutic modalities: Cognitive Behavioral Therapy, Solution-Oriented/Positive Psychology, and  Ego-Supportive  Mental Status/Observations:  Appearance:   Casual     Behavior:  Appropriate  Motor:  Normal  Speech/Language:   Clear and Coherent  Affect:  Appropriate  Mood:  anxious and dysthymic  Thought process:  normal  Thought content:    WNL  Sensory/Perceptual disturbances:    WNL  Orientation:  Fully oriented  Attention:  Good    Concentration:  Good  Memory:  WNL  Insight:    Good  Judgment:   Good  Impulse Control:  Fair   Risk Assessment: Danger to Self: No Self-injurious Behavior: No Danger to Others: No Physical Aggression / Violence: No Duty to Warn: No Access to Firearms a concern: No  Assessment of progress:  progressing  Diagnosis:   ICD-10-CM   1. Adjustment disorder with depressed mood  F43.21     2. r/o Dysthymia, MDD-R, PTSD  R69     3. Compulsive overeating  F50.89    Treating with [id number]    Plan:  Go[facility] this week [facility] and do a small encounter with home weights Practice trust in friends to be discreet/understanding, and if confronted be ready to acknowledge speaking out of turn and realizing it Go ahead and try out other places of interest as able, risk being the visitor/seeker, and see what she finds Journal ad lib, note any priorities to process and any thought patterns that may tend to exacerbate distress or dysphoria Monitor for any need to process traumatic injury and aftermath Other recommendations/advice as may be noted above  Continue to utilize previously learned skills ad lib Maintain medication as prescribed and work faithfully with relevant prescriber(s) if any changes are desired or seem indicated Call the clinic on-call service, 988/hotline, 911, or present to [facility] or ER if any life-threatening psychiatric crisis Return for as already scheduled. Already scheduled visit in this office 10/03/2022.  Blanchie Serve, PhD Luan Moore, PhD LP Clinical Psychologist, [facility] [address], [place], [place] [address] [phone]

## 2022-10-03 ENCOUNTER — Ambulatory Visit (INDEPENDENT_AMBULATORY_CARE_PROVIDER_SITE_OTHER): Payer: 59 | Admitting: Psychiatry

## 2022-10-03 DIAGNOSIS — F5089 Other specified eating disorder: Secondary | ICD-10-CM

## 2022-10-03 DIAGNOSIS — R69 Illness, unspecified: Secondary | ICD-10-CM | POA: Diagnosis not present

## 2022-10-03 DIAGNOSIS — F325 Major depressive disorder, single episode, in full remission: Secondary | ICD-10-CM | POA: Diagnosis not present

## 2022-10-03 DIAGNOSIS — F4321 Adjustment disorder with depressed mood: Secondary | ICD-10-CM | POA: Diagnosis not present
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
## 2022-10-04 ENCOUNTER — Other Ambulatory Visit (HOSPITAL_COMMUNITY): Payer: Self-pay

## 2022-10-09 ENCOUNTER — Ambulatory Visit (INDEPENDENT_AMBULATORY_CARE_PROVIDER_SITE_OTHER): Payer: 59 | Admitting: Psychiatry

## 2022-10-09 DIAGNOSIS — F325 Major depressive disorder, single episode, in full remission: Secondary | ICD-10-CM

## 2022-10-09 DIAGNOSIS — F4321 Adjustment disorder with depressed mood: Secondary | ICD-10-CM

## 2022-10-09 DIAGNOSIS — R69 Illness, unspecified: Secondary | ICD-10-CM

## 2022-10-09 DIAGNOSIS — F5089 Other specified eating disorder: Secondary | ICD-10-CM | POA: Diagnosis not present
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
## 2022-10-17 ENCOUNTER — Ambulatory Visit: Payer: Self-pay | Admitting: Psychiatry

## 2022-10-24 ENCOUNTER — Ambulatory Visit (INDEPENDENT_AMBULATORY_CARE_PROVIDER_SITE_OTHER): Payer: 59 | Admitting: Psychiatry

## 2022-10-24 DIAGNOSIS — F4321 Adjustment disorder with depressed mood: Secondary | ICD-10-CM | POA: Diagnosis not present

## 2022-10-24 DIAGNOSIS — F325 Major depressive disorder, single episode, in full remission: Secondary | ICD-10-CM

## 2022-10-24 DIAGNOSIS — R69 Illness, unspecified: Secondary | ICD-10-CM

## 2022-10-24 DIAGNOSIS — F5089 Other specified eating disorder: Secondary | ICD-10-CM | POA: Diagnosis not present
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
## 2022-10-31 ENCOUNTER — Ambulatory Visit: Payer: Self-pay | Admitting: Psychiatry

## 2022-11-13 DIAGNOSIS — I10 Essential (primary) hypertension: Secondary | ICD-10-CM | POA: Diagnosis not present

## 2022-11-13 DIAGNOSIS — E6609 Other obesity due to excess calories: Secondary | ICD-10-CM | POA: Diagnosis not present

## 2022-11-13 DIAGNOSIS — E782 Mixed hyperlipidemia: Secondary | ICD-10-CM | POA: Diagnosis not present

## 2022-11-27 ENCOUNTER — Ambulatory Visit: Payer: 59 | Admitting: Psychiatry

## 2022-12-06 DIAGNOSIS — H5213 Myopia, bilateral: Secondary | ICD-10-CM | POA: Diagnosis not present

## 2022-12-12 ENCOUNTER — Ambulatory Visit (INDEPENDENT_AMBULATORY_CARE_PROVIDER_SITE_OTHER): Payer: 59 | Admitting: Psychiatry

## 2022-12-12 DIAGNOSIS — F325 Major depressive disorder, single episode, in full remission: Secondary | ICD-10-CM | POA: Diagnosis not present

## 2022-12-12 DIAGNOSIS — F5089 Other specified eating disorder: Secondary | ICD-10-CM

## 2022-12-12 DIAGNOSIS — R69 Illness, unspecified: Secondary | ICD-10-CM

## 2022-12-12 DIAGNOSIS — F4321 Adjustment disorder with depressed mood: Secondary | ICD-10-CM

## 2022-12-12 NOTE — Progress Notes (Signed)
Psychotherapy Progress Note [facility] [name], PhD LP  Patient ID: Michelle Haynes)    MRN: [id number] Therapy format: Individual psychotherapy Date: 12/12/2022      Start: 5:14p     Stop: 6:03p     Time Spent: 49 min Location: In-person   Session narrative (presenting needs, interim history, self-report of stressors and symptoms, applications of prior therapy, status changes, and interventions made in session) Has refigured her schedule.  Affected by [facility] cutting coverage for [facility], though she did just get up to 1.0 mg this Sunday.  Know she has to come off, and that she will have to figure out her relationship to food by then.  Hx Weight Watchers working, just harder than a miracle drug.  Knows she eats for loneliness.  Finds Reginal Lutes has helped tame her alcohol impulse, and it has also helped slow her digestion, enough to the point where her IBS is much more under control.  Endorsed option, encouraged to use her [employer] tools alongside the drug.    Still hasn't crossed the threshold for church.  Discussed ways of overcoming Sunday morning resistance.  Could ask her renter Gillis Santa to go with her.  Affirmed and encouraged.  Notes again how her negative thinking gets involved, strong tendencies to shame herself for weakness, indulgence, not being serious enough in self-discipline, etc.  Proposed and adopted slogan -- "The beating drives the eating -- so no beating required."  Therapeutic modalities: Cognitive Behavioral Therapy, Solution-Oriented/Positive Psychology, Ego-Supportive, and Faith-sensitive  Mental Status/Observations:  Appearance:   Casual     Behavior:  Appropriate  Motor:  Normal  Speech/Language:   Clear and Coherent  Affect:  Appropriate  Mood:  anxious and dysthymic, some better  Thought process:  normal  Thought content:    WNL  Sensory/Perceptual disturbances:    WNL  Orientation:  Fully oriented  Attention:  Good    Concentration:  Fair   Memory:  WNL  Insight:    Good  Judgment:   Good  Impulse Control:  Good   Risk Assessment: Danger to Self: No Self-injurious Behavior: No Danger to Others: No Physical Aggression / Violence: No Duty to Warn: No Access to Firearms a concern: No  Assessment of progress:  progressing  Diagnosis:   ICD-10-CM   1. Adjustment disorder with depressed mood  F43.21     2. Major depressive disorder, single episode, in remission (HCC)  F32.5     3. Compulsive overeating  F50.89     4. r/o Dysthymia, PTSD  R69      Plan:  Social involvement/roots -- Try again reaching church; it does not have to be a way of committing where she does not want to stay, just making the best of being here while here.  Go ahead and try out other places of interest as able, risk being the visitor/seeker, and see what she finds.  Build out from healed friendship with [name]. Weight mgmt -- Endorse continued Wegovy, if tolerated.  Continue looking for ways to do small commitments in exercise -- 10-minute video, for one.  Continue practicing restraint and healthful variety in food, esp using Weight Watchers tools she is familiar with. Social anxiety -- Practice trust in friends to be discreet, understanding, and available, and be ready to acknowledge complaints if any actually come up. Mood management and self-esteem -- Journal ad lib, note any priorities to process and any thought patterns that may tend to exacerbate distress or dysphoria, especially shaming.  Use slogan "The beating drives the eating; no beating required."  Monitor for compulsive drinking.  Monitor for any need to process intense memories.   Spiritual -- Open question whether God has ways she does not know for reaching her son, etc. Other recommendations/advice as may be noted above Continue to utilize previously learned skills ad lib Maintain medication as prescribed and work faithfully with relevant prescriber(s) if any changes are desired or seem  indicated Call the clinic on-call service, 988/hotline, 911, or present to [facility] or ER if any life-threatening psychiatric crisis Return for time as already scheduled, recommend sched ahead. Already scheduled visit in this office 12/26/2022.  Robley Fries, PhD [name], PhD LP Clinical Psychologist, [facility] [address] [place], [place] [address] [phone]

## 2022-12-24 ENCOUNTER — Other Ambulatory Visit (HOSPITAL_COMMUNITY): Payer: Self-pay

## 2022-12-26 ENCOUNTER — Ambulatory Visit (INDEPENDENT_AMBULATORY_CARE_PROVIDER_SITE_OTHER): Payer: 59 | Admitting: Psychiatry

## 2022-12-26 DIAGNOSIS — F325 Major depressive disorder, single episode, in full remission: Secondary | ICD-10-CM

## 2022-12-26 DIAGNOSIS — F5089 Other specified eating disorder: Secondary | ICD-10-CM

## 2022-12-26 DIAGNOSIS — R69 Illness, unspecified: Secondary | ICD-10-CM

## 2022-12-26 DIAGNOSIS — F4321 Adjustment disorder with depressed mood: Secondary | ICD-10-CM

## 2022-12-26 NOTE — Progress Notes (Signed)
Psychotherapy Progress Note [facility] [name], PhD LP  Patient ID: Michelle Haynes)    MRN: [id number] Therapy format: Individual psychotherapy Date: 12/26/2022      Start: 5:14p     Stop: 6:04p     Time Spent: 50 min Location: In-person   Session narrative (presenting needs, interim history, self-report of stressors and symptoms, applications of prior therapy, status changes, and interventions made in session) Made it to [facility], as intended, on Sunday, enjoyed the service, learned more what is available.  Glad she went.  Motivated by having made a promise here, went without her roommate, and found the message relevant and assuring of being truly Bible-based.  Her litmus test being whether the church takes a stand against homosexuality.  (Says it's a bright line for her, notwithstanding the apparent inconsistency of lustful references she has made for herself.)    Has begun lifting weights, conveniently in front of TV, with a stick note to log reps.  Continues on compounded semaglutide now, through a wellness clinic in [place], feeling the effectiveness is worth self-funding as her insurance plan rolls back coverage for [facility].  Continues to find herself substantially relieved of both food and alcohol impulses.  Also working on a [employer] plan using an Pharmacist, hospital.  Knows she tends to be all-or-nothing about self-discipline, e.g., if she indulges a donut for breakfast, says screw it, lets herself gorge, except on Wegovy, just can't.  Finding she eats more slowly, too, and heartburn is calming down, all attributable to lower carb load, less weight on hiatal area, and perhaps reduced inflammatory response.  Affirmed and encouraged in continuing the plan with or without the drug, and keeping tabs on her tendency to shame herself, and hold down the "beating" that drives the "eating".  Has decided to reduce therapy schedule as well, having  satisfactorily worked through recovery of her friendship, broken the ice making it to church, allowing herself to put down some roots regardless of whether she means to remain here or go back to [place], and having established momentum taming compulsive eating and reducing weight.  Resolved to keep with the efforts she's making now and achieve some consistency, including the food measures and exploring more what Laurelyn Sickle has to offer socially.    Therapeutic modalities: Cognitive Behavioral Therapy, Solution-Oriented/Positive Psychology, Environmental manager, and Faith-sensitive  Mental Status/Observations:  Appearance:   Casual     Behavior:  Appropriate  Motor:  Normal  Speech/Language:   Clear and Coherent  Affect:  Appropriate  Mood:  improving  Thought process:  normal  Thought content:    WNL and less worry/shame  Sensory/Perceptual disturbances:    WNL  Orientation:  Fully oriented  Attention:  Good    Concentration:  Good  Memory:  WNL  Insight:    Good  Judgment:   Good  Impulse Control:  Good   Risk Assessment: Danger to Self: No Self-injurious Behavior: No Danger to Others: No Physical Aggression / Violence: No Duty to Warn: No Access to Firearms a concern: No  Assessment of progress:  progressing  Diagnosis:   ICD-10-CM   1. Adjustment disorder with depressed mood  F43.21     2. Major depressive disorder, single episode, in remission (HCC)  F32.5     3. Compulsive overeating  F50.89    improved with semaglutide and use of [employer] plan    4. r/o Dysthymia, PTSD  R69      Plan:  Social involvement/roots --  Maintain church involvement, despite possibility of pulling up and moving.  Allow herself to join up with groups or ministries of choice.  Build out from healed friendship with [name] in other ways as available. Weight mgmt -- Endorse continued semaglutide, as tolerated and affordable.  Continue looking for ways to do small commitments in exercise -- 10-minute  video, for one.  Continue practicing restraint and healthful variety in food, esp using Weight Watchers tools she is familiar with. Social anxiety -- Practice trust in friends to be discreet, understanding, and available, and be ready to acknowledge complaints if any actually come up. Mood management and self-esteem -- Journal ad lib, note any priorities to process and any thought patterns that may tend to exacerbate distress or dysphoria, especially shaming.  Use slogan "The beating drives the eating; no beating required."  Monitor for compulsive drinking.  Monitor for any need to process intense memories or regrets.   Spiritual -- Open question whether God has ways she does not know for reaching her son, etc. Other recommendations/advice as may be noted above Continue to utilize previously learned skills ad lib Maintain medication as prescribed and work faithfully with relevant prescriber(s) if any changes are desired or seem indicated Call the clinic on-call service, 988/hotline, 911, or present to [facility] or ER if any life-threatening psychiatric crisis Return for time as already scheduled. Already scheduled visit in this office 01/16/2023.  Robley Fries, PhD [name], PhD LP Clinical Psychologist, [facility] [address], [address] [place], [place] [address] [phone]

## 2023-01-16 ENCOUNTER — Ambulatory Visit: Payer: 59 | Admitting: Psychiatry

## 2023-02-13 ENCOUNTER — Ambulatory Visit (INDEPENDENT_AMBULATORY_CARE_PROVIDER_SITE_OTHER): Payer: 59 | Admitting: Psychiatry

## 2023-02-13 DIAGNOSIS — F509 Eating disorder, unspecified: Secondary | ICD-10-CM

## 2023-02-13 DIAGNOSIS — F331 Major depressive disorder, recurrent, moderate: Secondary | ICD-10-CM

## 2023-02-13 DIAGNOSIS — F341 Dysthymic disorder: Secondary | ICD-10-CM

## 2023-02-13 NOTE — Progress Notes (Signed)
Psychotherapy Progress Note [facility] [name], PhD LP  Patient ID: Michelle Haynes)    MRN: [id number] Therapy format: Individual psychotherapy Date: 02/13/2023      Start: 6:11p     Stop: 7:16p     Time Spent: 65 min Location: In-person   Session narrative (presenting needs, interim history, self-report of stressors and symptoms, applications of prior therapy, status changes, and interventions made in session) Visited [facility], good to see Michelle Haynes, her good best friend, albeit spiced with seeing her suffer some irritations in her marriage.  Fairly considerable alcohol, but essentially temperate herself.  Substantial concerns for Michelle Haynes, managing risks with a family tendency to substance abuse and two sons in trouble.  Side discussion of Jews who don't believe Jesus is the one way, and how she remains particularly wary of many versions of Christianity, for their more relaxed stances on human sexuality.    Admits she is struggling more with depression.  Tithes the [employer] in [place], believes in it for reaching out to lost men, but it's not satisfying.  Attributable in part to letdown after seeing her friend, partly to continuing to resist attending both worship and ed classes.  Supportively challenged to see herself through the resistance, partly as gift to herself, partly for the sake of faith as she knows it, partly to stop working for the "enemy" (variously conceived as depression, low self-esteem, or the devil).  Still feels ill at ease about relationship with sister.  Figures to see her at a wedding in October.  Discussed forecast stress and how she'd like to respond.  Continued struggle with weight and compulsive eating.  Therapeutic modalities: Cognitive Behavioral Therapy, Solution-Oriented/Positive Psychology, Environmental manager, and Faith-sensitive  Mental Status/Observations:  Appearance:   Casual     Behavior:  Appropriate  Motor:  Normal   Speech/Language:   Clear and Coherent  Affect:  Appropriate  Mood:  dysthymic  Thought process:  normal  Thought content:    WNL  Sensory/Perceptual disturbances:    WNL  Orientation:  Fully oriented  Attention:  Good    Concentration:  Good  Memory:  WNL  Insight:    Good  Judgment:   Good  Impulse Control:  Fair   Risk Assessment: Danger to Self: No Self-injurious Behavior: No Danger to Others: No Physical Aggression / Violence: No Duty to Warn: No Access to Firearms a concern: No  Assessment of progress:  stabilized  Diagnosis:   ICD-10-CM   1. Major depressive disorder, recurrent episode, moderate (HCC)  F33.1     2. Persistent depressive disorder  F34.1     3. Eating disorder, unspecified type  F50.9      Plan:  Social involvement/roots -- Maintain church involvement, despite possibility of pulling up and moving.  Allow herself to join up with groups or ministries of choice.  Build out from healed friendship with Michelle Haynes in other ways as available. Weight mgmt -- Endorse continued semaglutide, as tolerated and affordable.  Continue looking for ways to do small commitments in exercise -- 10-minute video, for one.  Continue practicing restraint and healthful variety in food, esp using Weight Watchers tools she is familiar with. Social anxiety -- Practice trust in friends to be discreet, understanding, and available, and be ready to acknowledge complaints if any actually come up. Mood management and self-esteem -- Journal ad lib, note any priorities to process and any thought patterns that may tend to exacerbate distress or dysphoria, especially shaming.  Use slogan "The beating drives the eating; no beating required."  Monitor for compulsive drinking.  Monitor for any need to process intense memories or regrets.   Spiritual -- Let it be an open question whether God has ways she does not know for reaching her son, etc. Other recommendations/advice -- As may be noted above.   Continue to utilize previously learned skills ad lib. Medication compliance -- Maintain medication as prescribed and work faithfully with relevant prescriber(s) if any changes are desired or seem indicated. Crisis service -- Aware of call list and work-in appts.  Call the clinic on-call service, 988/hotline, 911, or present to [facility] or ER if any life-threatening psychiatric crisis. Followup -- Return for time as already scheduled.  Next scheduled visit with me 03/20/2023.  Next scheduled in this office 03/20/2023.  Robley Fries, PhD [name], PhD LP Clinical Psychologist, [facility] [address], [address] [place], [place] [address] [phone]

## 2023-03-20 ENCOUNTER — Ambulatory Visit: Payer: 59 | Admitting: Psychiatry

## 2023-03-20 DIAGNOSIS — F4321 Adjustment disorder with depressed mood: Secondary | ICD-10-CM

## 2023-03-20 NOTE — Progress Notes (Signed)
Admin note for non-service contact  Patient ID: Michelle Haynes  MRN: [id number] DATE: 03/20/2023  Pt unavailable when Tx came free, reached by phone while driving.  Had to leave before seen, with prior service running over by about 30 min and a dog at home already waiting long.  CA no charge, has August appt, agrees to wait list for 4p, 5p, or 6pm availability in the interim.  Robley Fries, PhD [name], PhD LP Clinical Psychologist, [facility] [address] [place], [place] [address] [phone]

## 2023-03-28 ENCOUNTER — Other Ambulatory Visit: Payer: Self-pay | Admitting: Internal Medicine

## 2023-03-28 DIAGNOSIS — Z1231 Encounter for screening mammogram for malignant neoplasm of breast: Secondary | ICD-10-CM

## 2023-04-23 ENCOUNTER — Ambulatory Visit (INDEPENDENT_AMBULATORY_CARE_PROVIDER_SITE_OTHER): Payer: 59 | Admitting: Psychiatry

## 2023-04-23 DIAGNOSIS — R69 Illness, unspecified: Secondary | ICD-10-CM

## 2023-04-23 DIAGNOSIS — F331 Major depressive disorder, recurrent, moderate: Secondary | ICD-10-CM | POA: Diagnosis not present

## 2023-04-23 DIAGNOSIS — F509 Eating disorder, unspecified: Secondary | ICD-10-CM

## 2023-04-23 DIAGNOSIS — F5104 Psychophysiologic insomnia: Secondary | ICD-10-CM | POA: Diagnosis not present

## 2023-04-23 DIAGNOSIS — F341 Dysthymic disorder: Secondary | ICD-10-CM | POA: Diagnosis not present
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
## 2023-04-29 ENCOUNTER — Ambulatory Visit
Admission: RE | Admit: 2023-04-29 | Discharge: 2023-04-29 | Disposition: A | Discharge status: Home / Self Care | Payer: 59 | Source: Ambulatory Visit | Attending: Internal Medicine | Admitting: Internal Medicine

## 2023-04-29 DIAGNOSIS — Z1231 Encounter for screening mammogram for malignant neoplasm of breast: Secondary | ICD-10-CM | POA: Diagnosis not present

## 2023-05-09 ENCOUNTER — Other Ambulatory Visit: Payer: Self-pay

## 2023-05-09 ENCOUNTER — Other Ambulatory Visit (HOSPITAL_COMMUNITY): Payer: Self-pay

## 2023-05-09 MED ORDER — ALPRAZOLAM 0.5 MG PO TABS
0.2500 mg | ORAL_TABLET | Freq: Every day | ORAL | 1 refills | Status: DC | PRN
  Filled 2023-05-09: qty 20, 20d supply, fill #0
  Filled 2023-09-07: qty 20, 20d supply, fill #1

## 2023-05-09 MED ORDER — ROSUVASTATIN CALCIUM 20 MG PO TABS
20.0000 mg | ORAL_TABLET | Freq: Every day | ORAL | 1 refills | Status: DC
  Filled 2023-05-09: qty 90, 90d supply, fill #0

## 2023-05-16 ENCOUNTER — Other Ambulatory Visit (HOSPITAL_COMMUNITY): Payer: Self-pay

## 2023-05-16 MED ORDER — COMIRNATY 30 MCG/0.3ML IM SUSY
0.3000 mL | PREFILLED_SYRINGE | INTRAMUSCULAR | 0 refills | Status: AC
  Filled 2023-05-16: qty 0.3, 1d supply, fill #0

## 2023-05-17 ENCOUNTER — Other Ambulatory Visit (HOSPITAL_COMMUNITY): Payer: Self-pay

## 2023-05-20 ENCOUNTER — Ambulatory Visit (INDEPENDENT_AMBULATORY_CARE_PROVIDER_SITE_OTHER): Payer: 59 | Admitting: Psychiatry

## 2023-05-20 DIAGNOSIS — F5104 Psychophysiologic insomnia: Secondary | ICD-10-CM

## 2023-05-20 DIAGNOSIS — F341 Dysthymic disorder: Secondary | ICD-10-CM | POA: Diagnosis not present

## 2023-05-20 DIAGNOSIS — F411 Generalized anxiety disorder: Secondary | ICD-10-CM

## 2023-05-20 DIAGNOSIS — F330 Major depressive disorder, recurrent, mild: Secondary | ICD-10-CM | POA: Diagnosis not present
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
## 2023-06-03 ENCOUNTER — Ambulatory Visit: Payer: 59 | Admitting: Psychiatry

## 2023-06-07 ENCOUNTER — Ambulatory Visit: Payer: 59 | Admitting: Psychiatry

## 2023-06-07 DIAGNOSIS — F411 Generalized anxiety disorder: Secondary | ICD-10-CM | POA: Diagnosis not present

## 2023-06-07 DIAGNOSIS — F509 Eating disorder, unspecified: Secondary | ICD-10-CM | POA: Diagnosis not present

## 2023-06-07 DIAGNOSIS — F341 Dysthymic disorder: Secondary | ICD-10-CM

## 2023-06-07 DIAGNOSIS — F5104 Psychophysiologic insomnia: Secondary | ICD-10-CM | POA: Diagnosis not present
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
## 2023-07-01 ENCOUNTER — Ambulatory Visit (INDEPENDENT_AMBULATORY_CARE_PROVIDER_SITE_OTHER): Payer: 59 | Admitting: Psychiatry

## 2023-07-01 DIAGNOSIS — F411 Generalized anxiety disorder: Secondary | ICD-10-CM

## 2023-07-01 DIAGNOSIS — F5104 Psychophysiologic insomnia: Secondary | ICD-10-CM | POA: Diagnosis not present

## 2023-07-01 DIAGNOSIS — F509 Eating disorder, unspecified: Secondary | ICD-10-CM

## 2023-07-01 DIAGNOSIS — F341 Dysthymic disorder: Secondary | ICD-10-CM

## 2023-07-01 NOTE — Progress Notes (Signed)
 Psychotherapy Progress Note [facility] Delora Ferry, PhD LP  Patient ID: Michelle Haynes)    MRN: [id number] Therapy format: Individual psychotherapy Date: 07/01/2023      Start: 4:12p     Stop: 5:02p     Time Spent: 50 min Location: In-person   Session narrative (presenting needs, interim history, self-report of stressors and symptoms, applications of prior therapy, status changes, and interventions made in session) Enjoyed 4 nights in [place] with Michelle Haynes.  A lot of heat and humidity, managed to pace alcohol, very friendly service, interesting adventure trying to get an emaciated dog on the beach taken care of.  Proud to have helped show Michelle Haynes a good time.    Realization the other day, having neither Michelle Haynes nor Michelle Haynes called her on her 62th birthday, that she was given a lot on her trip, and she's being a bit needy.  Chalked it up to [facility].    Michelle Haynes's wedding went very well, though it was far out -- kind of Goth meets [employer], with  black dresses, officiant in costume, and vows that sounded odd and cheeky to Michelle Haynes.  Still good to be there, support Michelle Haynes, practice holding her own tongue, and positively impressed with DIL Michelle Haynes.  Took advice to keep it simple, not try to preach anything, though there may be a moment coming where she cautions him about shaming Michelle Haynes in front of others.  Continuing peace about hypothetical fear of Michelle Haynes dying, since last session's psychodrama intervention.  Has gone up on semaglutide  injx this weekend, having gotten stuck around 170#.  Looking to continue now, as she sees more benefit and reduced pricing.    Got a next renter for her home, another travel nurse, for Dec-Feb.  Blessing to have it work out easily.    Therapeutic modalities: Cognitive Behavioral Therapy, Solution-Oriented/Positive Psychology, Environmental manager, and Faith-sensitive  Mental Status/Observations:  Appearance:   Casual     Behavior:  Appropriate   Motor:  Normal  Speech/Language:   Clear and Coherent  Affect:  Appropriate  Mood:  normal and less anxious  Thought process:  normal  Thought content:    Less intrusive thoughts  Sensory/Perceptual disturbances:    WNL  Orientation:  Fully oriented  Attention:  Good    Concentration:  Fair  Memory:  WNL  Insight:    Variable  Judgment:   Good  Impulse Control:  Good   Risk Assessment: Danger to Self: No Self-injurious Behavior: No Danger to Others: No Physical Aggression / Violence: No Duty to Warn: No Access to Firearms a concern: No  Assessment of progress:  progressing  Diagnosis:   ICD-10-CM   1. Persistent depressive disorder  F34.1     2. Generalized anxiety disorder  F41.1     3. Eating disorder, unspecified type  F50.9     4. Psychophysiological insomnia  F51.04      Plan:  Social involvement/roots -- Maintain church involvement, despite possibility of pulling up and moving.  Allow herself to join up with groups or ministries of choice.  Build out from healed friendship with Michelle Haynes in other ways as available. Weight mgmt -- Endorse continued GLP-1, as tolerated and affordable.  Continue looking for ways to do small commitments in exercise -- 10-minute video, for one.  Continue practicing restraint and healthful variety in food, esp using Weight Watchers tools she is familiar with.  Best limit alcohol. Social anxiety -- Practice trust in friends to be discreet, understanding,  and available, and be ready to acknowledge complaints if any actually come up.  Manage anger to reduce likelihood of alienating.  As body image is a potent concern, practice accepting "good enough" in any way possible. Mood management and self-esteem -- Journal ad lib, note any priorities to process and any thought patterns that may tend to exacerbate distress or dysphoria, especially shaming.  Use slogan "The beating drives the eating; no beating required."  Monitor for compulsive drinking.   Monitor for any need to process intense memories or regrets.   Sleep -- Observe limits on melatonin and don't expect it to force sleep; dose 3-10 mg at discretion 1-2 hrs before intended bedtime and practice sleep hygiene measures.  Try out amber lenses as a more natural way.  Pay attention to conscious consent to sleep and truly settling regrets and worries. Spiritual concerns -- Let it be an open question whether God has ways she does not know for reaching her son, others, and prioritize showing rather grace over preaching. Other recommendations/advice -- As may be noted above.  Continue to utilize previously learned skills ad lib. Medication compliance -- Maintain medication as prescribed and work faithfully with relevant prescriber(s) if any changes are desired or seem indicated. Crisis service -- Aware of call list and work-in appts.  Call the clinic on-call service, 988/hotline, 911, or present to [facility] or ER if any life-threatening psychiatric crisis. Followup -- Return for time as already scheduled.  Next scheduled visit with me 07/10/2023.  Next scheduled in this office 07/10/2023.  Maretta Shaper, PhD Delora Ferry, PhD LP Clinical Psychologist, [facility] [address], [address] [place], [place] [address] [phone]

## 2023-07-02 ENCOUNTER — Other Ambulatory Visit (HOSPITAL_COMMUNITY): Payer: Self-pay

## 2023-07-02 DIAGNOSIS — L814 Other melanin hyperpigmentation: Secondary | ICD-10-CM | POA: Diagnosis not present

## 2023-07-02 DIAGNOSIS — D485 Neoplasm of uncertain behavior of skin: Secondary | ICD-10-CM | POA: Diagnosis not present

## 2023-07-02 DIAGNOSIS — L821 Other seborrheic keratosis: Secondary | ICD-10-CM | POA: Diagnosis not present

## 2023-07-02 MED ORDER — HYDROQUINONE 4 % EX CREA
1.0000 | TOPICAL_CREAM | Freq: Every day | CUTANEOUS | 2 refills | Status: AC
  Filled 2023-07-02: qty 28.35, 30d supply, fill #0

## 2023-07-10 ENCOUNTER — Ambulatory Visit: Payer: 59 | Admitting: Psychiatry

## 2023-07-18 ENCOUNTER — Ambulatory Visit (INDEPENDENT_AMBULATORY_CARE_PROVIDER_SITE_OTHER): Payer: 59 | Admitting: Psychiatry

## 2023-07-18 DIAGNOSIS — F5104 Psychophysiologic insomnia: Secondary | ICD-10-CM | POA: Diagnosis not present

## 2023-07-18 DIAGNOSIS — F341 Dysthymic disorder: Secondary | ICD-10-CM | POA: Diagnosis not present

## 2023-07-18 DIAGNOSIS — F509 Eating disorder, unspecified: Secondary | ICD-10-CM | POA: Diagnosis not present

## 2023-07-18 DIAGNOSIS — F411 Generalized anxiety disorder: Secondary | ICD-10-CM | POA: Diagnosis not present

## 2023-07-18 NOTE — Progress Notes (Signed)
 Psychotherapy Progress Note [facility] Delora Ferry, PhD LP  Patient ID: Michelle Haynes)    MRN: [id number] Therapy format: Individual psychotherapy Date: 07/18/2023      Start: 4:25p     Stop: 5:15p     Time Spent: 50 min Location: In-person   Session narrative (presenting needs, interim history, self-report of stressors and symptoms, applications of prior therapy, status changes, and interventions made in session) Continuing semaglutide  shots, split into 30ml 2/wk.  Working effectively, though nausea still requires Zofran  sometimes.    Son Michelle Haynes went to the dr yesterday but no word as yet.  He tends to be mysterious and opaque about these things, sparking further worry for him.  Could hit up his wife Michelle Haynes, but reluctant to come off as meddling.  Affirmed her choice, and using the opportunity to cope with uncertainty.  Continues to benefit from psychodrama exercise hearing Jesus asking her to trust.  Still struggles with worry over Michelle Haynes the most, especially since his salmonella poisoning and heart attack at 62yo.  Discussed ongoing project to exercise her restraint and trust beyond her doubts.  Disappointed in the election.  Concerned to hear a TV preacher she loves and respects as a man of God lauding the results, and effectively aiding the degradation of women and looming assault on democracy.  Therapeutic modalities: Cognitive Behavioral Therapy, Solution-Oriented/Positive Psychology, Environmental manager, and Faith-sensitive  Mental Status/Observations:  Appearance:   Casual     Behavior:  Appropriate  Motor:  Normal  Speech/Language:   Clear and Coherent  Affect:  Appropriate  Mood:  anxious and milder  Thought process:  normal  Thought content:    WNL  Sensory/Perceptual disturbances:    WNL  Orientation:  Fully oriented  Attention:  Good    Concentration:  Fair  Memory:  WNL  Insight:    Variable  Judgment:   Good  Impulse Control:  Good   Risk  Assessment: Danger to Self: No Self-injurious Behavior: No Danger to Others: No Physical Aggression / Violence: No Duty to Warn: No Access to Firearms a concern: No  Assessment of progress:  progressing  Diagnosis:   ICD-10-CM   1. Persistent depressive disorder  F34.1     2. Generalized anxiety disorder  F41.1     3. Eating disorder, unspecified type  F50.9     4. Psychophysiological insomnia  F51.04      Plan:  Social involvement/roots -- Maintain church involvement, despite possibility of pulling up and moving.  Allow herself to join up with groups or ministries of choice.  Build out from healed friendship with [name] in other ways as available. Weight mgmt -- Endorse continued GLP-1, as tolerated and affordable.  Continue looking for ways to do small commitments in exercise -- 10-minute video, for one.  Continue practicing restraint and healthful variety in food, esp using Weight Watchers tools she is familiar with.  Best limit alcohol. Social anxiety -- Practice trust in friends to be discreet, understanding, and available, and be ready to acknowledge complaints if any actually come up.  Manage anger to reduce likelihood of alienating.  As body image is a potent concern, practice accepting "good enough" in any way possible. Mood management and self-esteem -- Journal ad lib, note any priorities to process and any thought patterns that may tend to exacerbate distress or dysphoria, especially shaming.  Use slogan "The beating drives the eating; no beating required."  Monitor for compulsive drinking.  Monitor for any need to  process intense memories or regrets.   Sleep -- Observe limits on melatonin and don't expect it to force sleep; dose 3-10 mg at discretion 1-2 hrs before intended bedtime and practice sleep hygiene measures.  Try out amber lenses as a more natural way.  Pay attention to conscious consent to sleep and truly settling regrets and worries. Spiritual concerns -- Let it be  an open question whether God has ways she does not know for reaching her son, others, and prioritize showing rather grace over preaching. Other recommendations/advice -- As may be noted above.  Continue to utilize previously learned skills ad lib. Medication compliance -- Maintain medication as prescribed and work faithfully with relevant prescriber(s) if any changes are desired or seem indicated. Crisis service -- Aware of call list and work-in appts.  Call the clinic on-call service, 988/hotline, 911, or present to [facility] or ER if any life-threatening psychiatric crisis. Followup -- Return for time as already scheduled.  Next scheduled visit with me 08/07/2023.  Next scheduled in this office 08/07/2023.  Maretta Shaper, PhD Delora Ferry, PhD LP Clinical Psychologist, [facility] [address], [address] [place], [place] [address] [phone]

## 2023-07-19 ENCOUNTER — Other Ambulatory Visit (HOSPITAL_COMMUNITY): Payer: Self-pay

## 2023-07-19 MED ORDER — ROSUVASTATIN CALCIUM 20 MG PO TABS
20.0000 mg | ORAL_TABLET | Freq: Every day | ORAL | 1 refills | Status: DC
  Filled 2023-07-19: qty 90, 90d supply, fill #0
  Filled 2023-10-28: qty 90, 90d supply, fill #1

## 2023-08-07 ENCOUNTER — Ambulatory Visit: Payer: 59 | Admitting: Psychiatry

## 2023-08-20 ENCOUNTER — Ambulatory Visit (INDEPENDENT_AMBULATORY_CARE_PROVIDER_SITE_OTHER): Payer: 59 | Admitting: Psychiatry

## 2023-08-20 DIAGNOSIS — F411 Generalized anxiety disorder: Secondary | ICD-10-CM | POA: Diagnosis not present

## 2023-08-20 DIAGNOSIS — F341 Dysthymic disorder: Secondary | ICD-10-CM | POA: Diagnosis not present

## 2023-08-20 DIAGNOSIS — F5104 Psychophysiologic insomnia: Secondary | ICD-10-CM | POA: Diagnosis not present

## 2023-08-20 DIAGNOSIS — F509 Eating disorder, unspecified: Secondary | ICD-10-CM

## 2023-08-20 NOTE — Progress Notes (Signed)
 Psychotherapy Progress Note [facility] Michelle Ferry, PhD LP  Patient ID: Michelle Haynes)    MRN: [id number] Therapy format: Individual psychotherapy Date: 08/20/2023      Start: 4:18p     Stop: 5:06p     Time Spent: 48 min Location: In-person   Session narrative (presenting needs, interim history, self-report of stressors and symptoms, applications of prior therapy, status changes, and interventions made in session) Thought about what her obsessive thoughts might be since last time, wrote down an example of wanting to buy a friend a candle, thinking of giving one to her son, and imagining a highly developed disaster scenario where he burns up in a house fire going back to rescue animals.  Credited her active imagination, challenged to exercise more authority over recognizing these as "daymares" and choosing to wake from them, primarily by grounding and changing focus.  Further discussion of also harnessing her imagination to visualize him successfully meeting threats, too.  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  Thinking she'd like to get back to yoga.  Christmas no plans to see Michelle Haynes and Michelle Haynes, which she accepts.  Affirmed and encouraged.  Therapeutic modalities: Cognitive Behavioral Therapy, Solution-Oriented/Positive Psychology, Environmental manager, and Faith-sensitive  Mental Status/Observations:  Appearance:   Casual     Behavior:  Appropriate  Motor:  Normal  Speech/Language:   Clear and Coherent  Affect:  Appropriate  Mood:  dysthymic  Thought process:  normal  Thought content:    Intrusive thoughts  Sensory/Perceptual disturbances:    WNL  Orientation:  Fully oriented  Attention:  Good    Concentration:  Fair  Memory:  WNL  Insight:    Good  Judgment:   Good  Impulse Control:  Good   Risk Assessment: Danger to Self:  No Self-injurious Behavior: No Danger to Others: No Physical Aggression / Violence: No Duty to Warn: No Access to Firearms a concern: No  Assessment of progress:  stabilized  Diagnosis:   ICD-10-CM   1. Persistent depressive disorder  F34.1     2. Generalized anxiety disorder  F41.1     3. Eating disorder, unspecified type  F50.9     4. Psychophysiological insomnia  F51.04      Plan:  Social involvement/roots -- Maintain church involvement, despite possibility of pulling up and moving.  Allow herself to join up with groups or ministries of choice.  Build out from healed friendship with Michelle Haynes in other ways as available. Weight mgmt -- Endorse continued GLP-1, as tolerated and affordable.  Continue looking for ways to do small commitments in exercise -- 10-minute video, for one.  Continue practicing restraint and healthful variety in food, esp using Weight Watchers tools she is familiar with.  Best limit alcohol. Social anxiety -- Practice trust in friends to be discreet, understanding, and available, and be ready to acknowledge complaints if any actually come up.  Manage anger to reduce likelihood of alienating.  As body image is a potent concern, practice accepting "good enough" in any way possible. Mood management and self-esteem -- Journal ad lib, note any priorities to process and any thought patterns that may tend to exacerbate distress or dysphoria, especially shaming.  Use slogan "The beating drives the eating; no beating required."  Monitor for compulsive drinking.  Monitor for any need to process intense memories or regrets.   Sleep -- Observe limits on melatonin  and don't expect it to force sleep; dose 3-10 mg at discretion 1-2 hrs before intended bedtime and practice sleep hygiene measures.  Try out amber lenses as a more natural way.  Pay attention to conscious consent to sleep and truly settling regrets and worries. Spiritual concerns -- Let it be an open question whether God  has ways she does not know for reaching her son, others, and prioritize showing rather grace over preaching. Other recommendations/advice -- As may be noted above.  Continue to utilize previously learned skills ad lib. Medication compliance -- Maintain medication as prescribed and work faithfully with relevant prescriber(s) if any changes are desired or seem indicated. Crisis service -- Aware of call list and work-in appts.  Call the clinic on-call service, 988/hotline, 911, or present to [facility] or ER if any life-threatening psychiatric crisis. Followup -- Return for time as already scheduled.  Next scheduled visit with me 09/13/2023.  Next scheduled in this office 09/13/2023.  Michelle Shaper, PhD Michelle Ferry, PhD LP Clinical Psychologist, [facility] [address] [place], [place] [address] [phone]

## 2023-09-04 IMAGING — CT CT ANKLE*R* W/O CM
3 series · 13 of 33 positions shown, 16 images · non-contrast
Comparison: None Available.

CLINICAL DATA: Ankle trauma, dislocation/ligament injury suspected
(Age >= 5y)

EXAM:
CT OF THE RIGHT ANKLE WITHOUT CONTRAST
TECHNIQUE: Multidetector CT imaging of the right ankle was performed according
to the standard protocol. Multiplanar CT image reconstructions were
also generated.
RADIATION DOSE REDUCTION: This exam was performed according to the
departmental dose-optimization program which includes automated
exposure control, adjustment of the mA and/or kV according to
patient size and/or use of iterative reconstruction technique.

[Series 4: lower ext 1.5 ax st · axial · 0.37mm/px · z∈[-223,-56]mm · 5 of 165 slices shown, 7 images]
[im 26/165  soft-tissue]
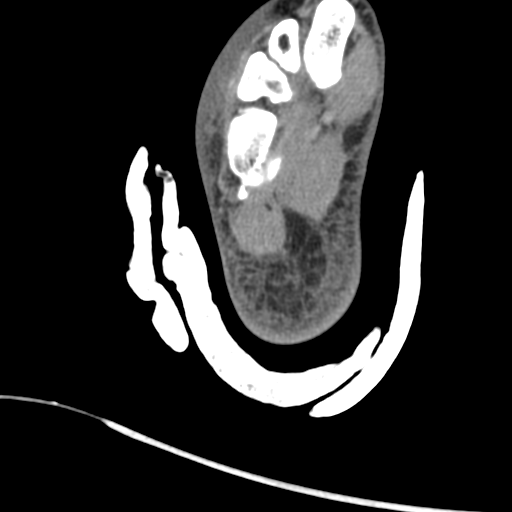
[im 26/165  bone]
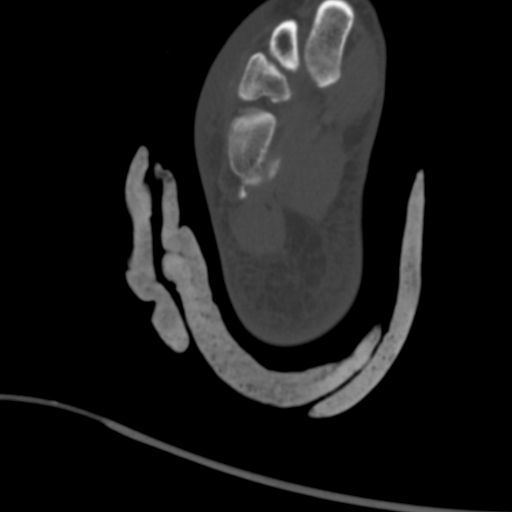
[im 51/165  bone]
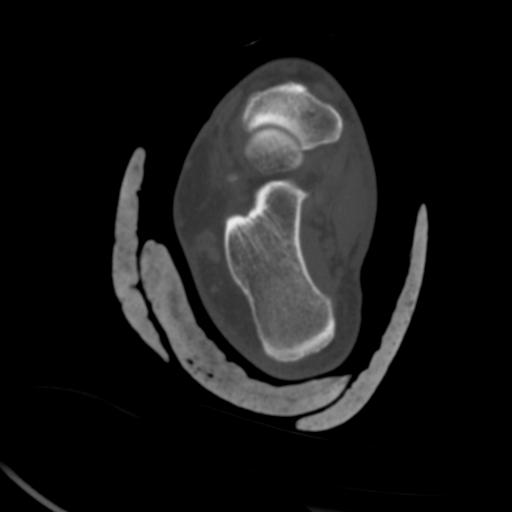
[im 89/165  bone]
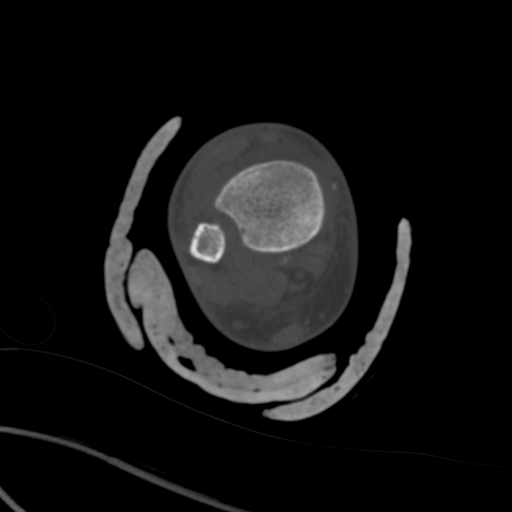
[im 114/165  bone]
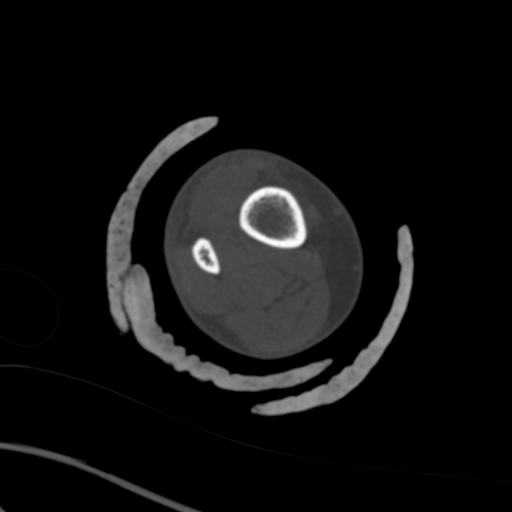
[im 139/165  soft-tissue]
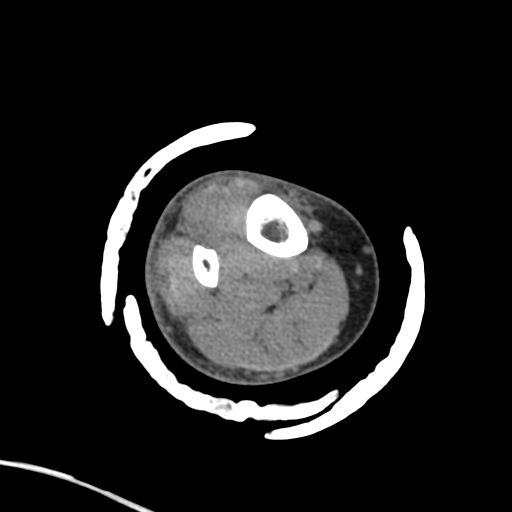
[im 139/165  bone]
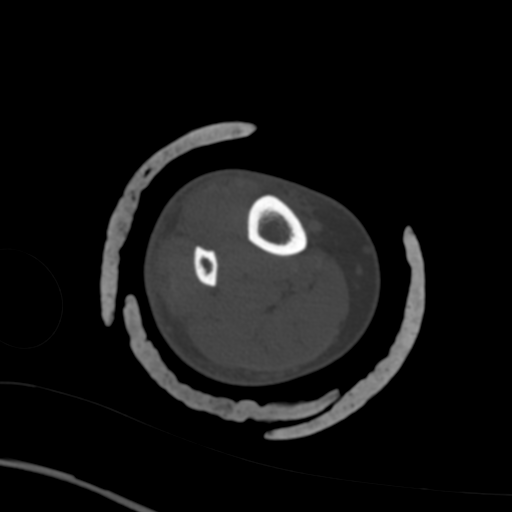

[Series 9: lower ext cor st · coronal · 0.37mm/px · 3 of 128 slices shown]
[im 26/128  bone]
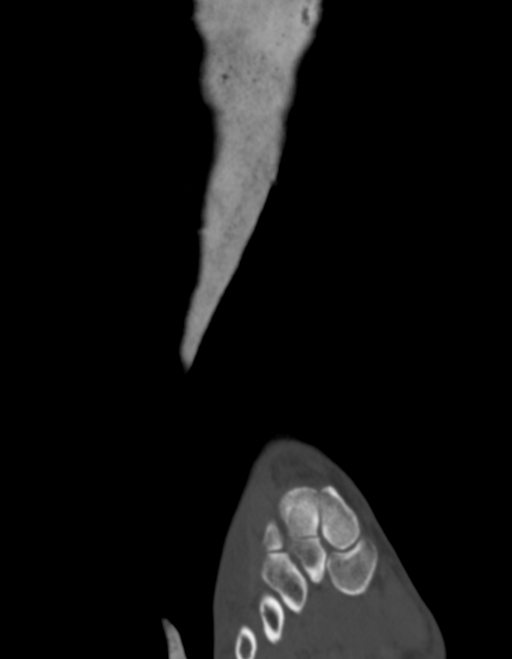
[im 51/128  bone]
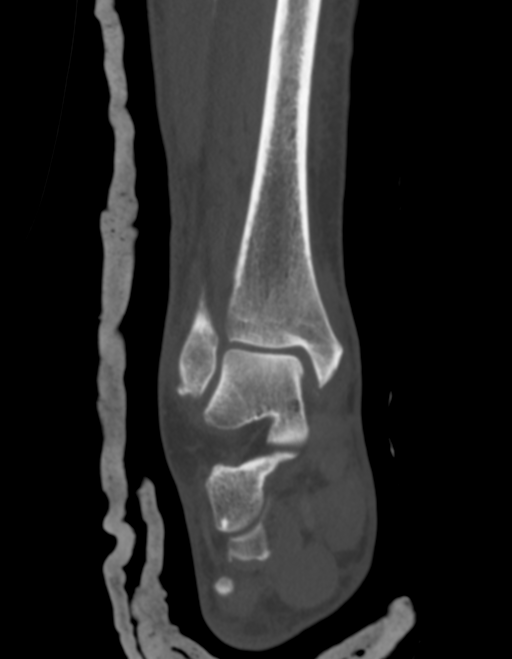
[im 77/128  bone]
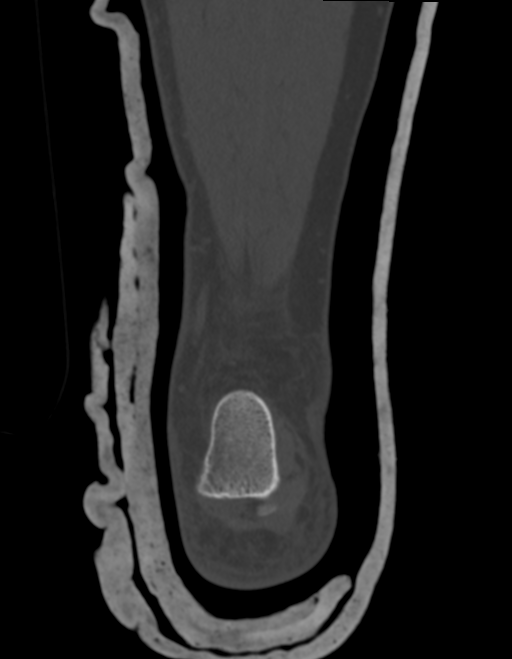

[Series 10: lower ext sag st · sagittal · 0.37mm/px · 5 of 128 slices shown, 6 images]
[im 43/128  bone]
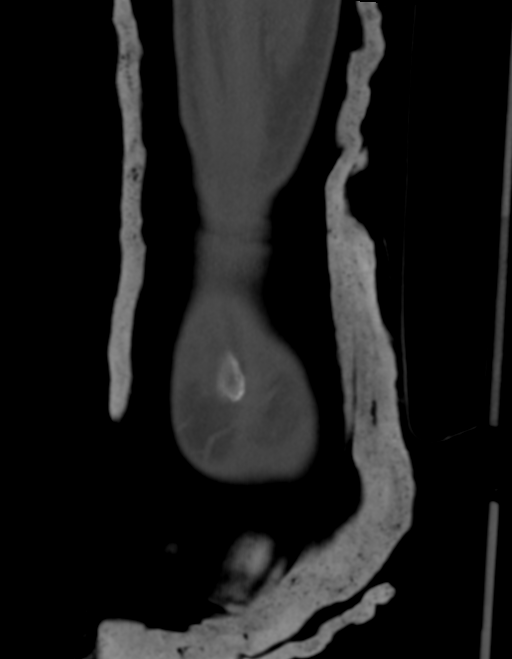
[im 53/128  bone]
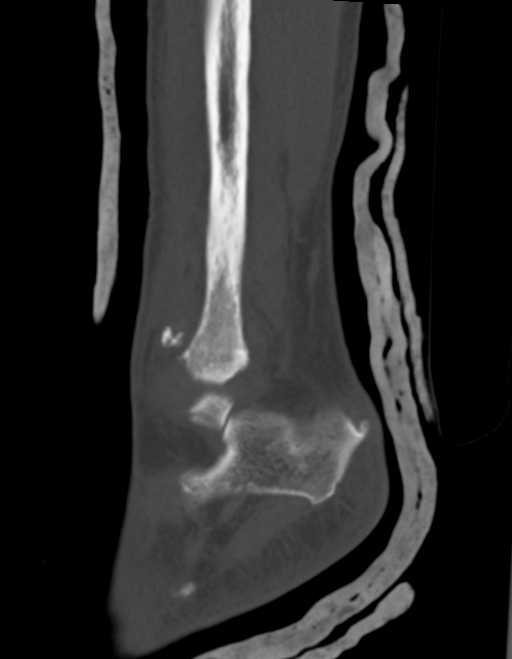
[im 64/128  soft-tissue]
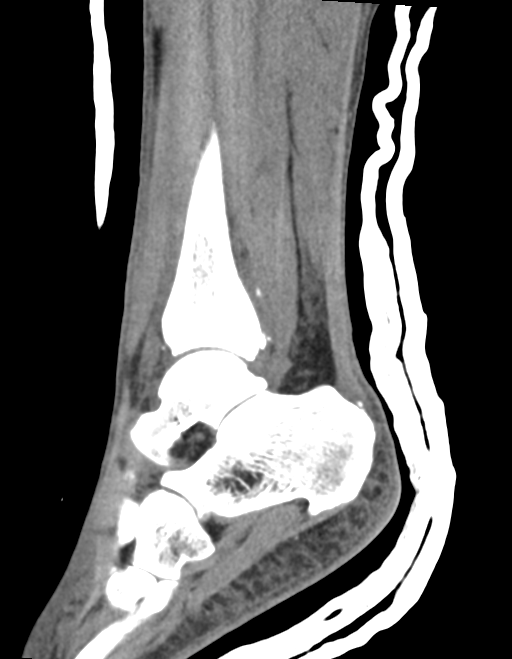
[im 64/128  bone]
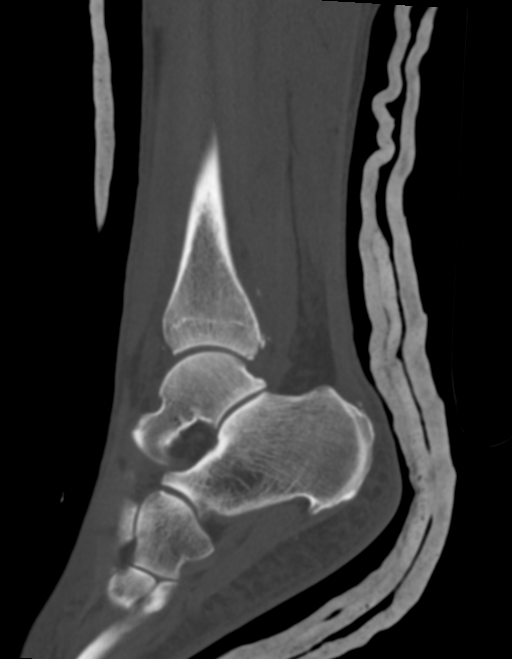
[im 75/128  bone]
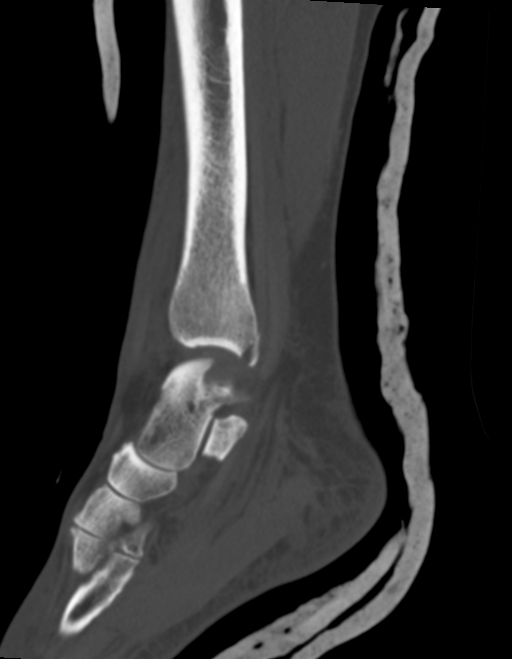
[im 85/128  bone]
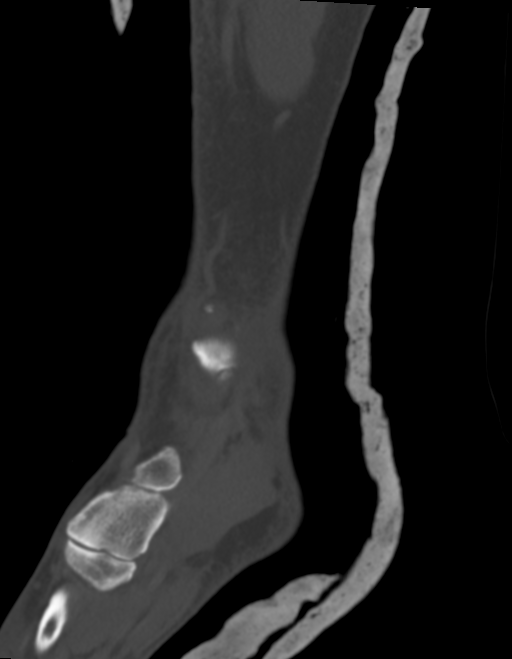

[13 of 33 positions shown; findings below may reference images not displayed]

FINDINGS: Bones/Joint/Cartilage

Mildly displaced medial malleolar avulsion fracture. Displaced
anterior tibiofibular ligament avulsion off of the distal fibula.
There is a mild displaced posterior malleolar fracture involving up
to 15% of the articular surface. There is also a focal posterior
tibial lip fracture more laterally at the attachment of the
posterior tibiofibular ligament (series 3, image 88). There are
multiple tiny bony fragments along the ankle joint. Preserved talar
dome and subtalar joints. No other fracture identified in the
visualized midfoot. Widening of the medial joint space.

There is moderate second tarsometatarsal joint osteoarthritis. There
is moderate navicular-cuneiform osteoarthritis. Probable os
peroneum.

Ligaments

Suboptimally assessed by CT.

Muscles and Tendons

No evidence of tendon entrapment.

Soft tissues

Soft tissue swelling of the foot and ankle. No focal fluid
collection.
IMPRESSION: Mildly displaced medial malleolar fracture, displaced anterior
tibiofibular ligament avulsion fracture off the distal fibula, and
mildly displaced posterior malleolar fracture involving up to 50% of
the articular surface. Additionally, there is a focal posterior
tibial lip fracture more laterally at the attachment of the
posterior tibiofibular ligament. Multiple tiny bony fragments are
present along the ankle joint. Widening of the medial joint space
suggestive of instability. If not already acquired, recommend tibia
and fibula radiographs to rule out a proximal fibular fracture.

## 2023-09-09 ENCOUNTER — Other Ambulatory Visit (HOSPITAL_COMMUNITY): Payer: Self-pay

## 2023-09-10 DIAGNOSIS — R7301 Impaired fasting glucose: Secondary | ICD-10-CM | POA: Diagnosis not present

## 2023-09-10 DIAGNOSIS — E785 Hyperlipidemia, unspecified: Secondary | ICD-10-CM | POA: Diagnosis not present

## 2023-09-10 DIAGNOSIS — R7989 Other specified abnormal findings of blood chemistry: Secondary | ICD-10-CM | POA: Diagnosis not present

## 2023-09-13 ENCOUNTER — Telehealth (INDEPENDENT_AMBULATORY_CARE_PROVIDER_SITE_OTHER): Payer: 59 | Admitting: Psychiatry

## 2023-09-13 DIAGNOSIS — F5089 Other specified eating disorder: Secondary | ICD-10-CM

## 2023-09-13 DIAGNOSIS — F341 Dysthymic disorder: Secondary | ICD-10-CM | POA: Diagnosis not present

## 2023-09-13 DIAGNOSIS — F411 Generalized anxiety disorder: Secondary | ICD-10-CM | POA: Diagnosis not present

## 2023-09-13 NOTE — Progress Notes (Addendum)
 Psychotherapy Progress Note [facility] Michelle Ferry, PhD LP  Patient ID: Michelle Haynes)    MRN: [id number] Therapy format: Individual psychotherapy Date: 09/13/2023      Start: 4:14p     Stop: 5:00p     Time Spent: 46 min Location: Telehealth visit -- I connected with this patient by an approved telecommunication method (video), with her informed consent, and verifying identity and patient privacy.  I was located at my home and patient at her home.  As needed, we discussed the limitations, risks, and security and privacy concerns associated with telehealth service, including the availability and conditions which currently govern in-person appointments and the possibility that 3rd-party payment may not be fully guaranteed and she may be responsible for charges.  After she indicated understanding, we proceeded with the session.  Also discussed treatment planning, as needed, including ongoing verbal agreement with the plan, the opportunity to ask and answer all questions, her demonstrated understanding of instructions, and her readiness to call the office should symptoms worsen or she feels she is in a crisis state and needs more immediate and tangible assistance.   Session narrative (presenting needs, interim history, self-report of stressors and symptoms, applications of prior therapy, status changes, and interventions made in session) Met by video d/t weather conditions.  Readying to go to [place] , wishes to lose more weight first.  Improved eating habits, restrained alcohol use, but relatively recent, pesky elevation in ALT.  Encouraged check with PCP, most likely trust it to be temporary, and retest at some point -- 3-6 months, say.    Will be seeing S Michelle Haynes soon, bring up an issue.  Nephew Michelle Haynes is professed trans, now going by the name Michelle Haynes.  Distressing to Michelle Haynes for a combination of things -- perceived deviancy, hx of dysfunctional parenting (notably, enabling a bar  for him and his teen friends, and father being so cowed about the threat of not being loved that he makes no objections, moves heaven and earth to please him/her, and gets manipulated into things like signing him out of the hospital AMA.  "Michelle Haynes" is homebound, wearing women's clothing, on hormones.  Has had quandary about whether to use preferred pronouns or whether that would (immorally) condone it vs. alienating S.  Endorsed kind impulse to try, and just acknowledge the awkwardness if needed.    Son Michelle Haynes had his endoscopy, found very severe esophageal erosion.  Hearkening back to when BIL [place] had stage 4 stomach CA by surprise, and of course died of it.  Glad to have hard evidence, know it's not to the point of cancer, and see Michelle Haynes motivated to tame his unhealthy habits.  Affirmed and encouraged.  Therapeutic modalities: Cognitive Behavioral Therapy, Solution-Oriented/Positive Psychology, and Ego-Supportive  Mental Status/Observations:  Appearance:   Casual     Behavior:  Appropriate  Motor:  Normal  Speech/Language:   Clear and Coherent  Affect:  Appropriate  Mood:  anxious  Thought process:  normal  Thought content:    WNL  Sensory/Perceptual disturbances:    WNL  Orientation:  Fully oriented  Attention:  Good    Concentration:  Good  Memory:  WNL  Insight:    Good  Judgment:   Good  Impulse Control:  Variable   Risk Assessment: Danger to Self: No Self-injurious Behavior: No Danger to Others: No Physical Aggression / Violence: No Duty to Warn: No Access to Firearms a concern: No  Assessment of progress:  progressing  Diagnosis:  ICD-10-CM   1. Persistent depressive disorder  F34.1     2. Generalized anxiety disorder  F41.1     3. Compulsive overeating  F50.89      Plan:  Social involvement/roots -- Maintain church involvement, despite possibility of pulling up and moving.  Allow herself to join up with groups or ministries of choice.  Build out from healed friendship  with Michelle Haynes in other ways as available. Weight mgmt -- Endorse continued GLP-1, as tolerated and affordable.  Continue looking for ways to do small commitments in exercise -- 10-minute video, for one.  Continue practicing restraint and healthful variety in food, esp using Weight Watchers tools she is familiar with.  Best limit alcohol. Social anxiety -- Practice trust in friends to be discreet, understanding, and available, and be ready to acknowledge complaints if any actually come up.  Manage anger to reduce likelihood of alienating.  As body image is a potent concern, practice accepting "good enough" in any way possible. Mood management and self-esteem -- Journal ad lib, note any priorities to process and any thought patterns that may tend to exacerbate distress or dysphoria, especially shaming.  Use slogan "The beating drives the eating; no beating required."  Monitor for compulsive drinking.  Monitor for any need to process intense memories or regrets.   Sleep -- Observe limits on melatonin and don't expect it to force sleep; dose 3-10 mg at discretion 1-2 hrs before intended bedtime and practice sleep hygiene measures.  Try out amber lenses as a more natural way.  Pay attention to conscious consent to sleep and truly settling regrets and worries. Spiritual concerns -- Let it be an open question whether God has ways she does not know for reaching her son, others, and prioritize showing rather grace over preaching. Family concerns -- Continue to show interest, voice support, refrain from surprise criticism with son Michelle Haynes.  If confrontation needed, get an acceptance first to have  difficult conversation. Other recommendations/advice -- As may be noted above.  Continue to utilize previously learned skills ad lib. Medication compliance -- Maintain medication as prescribed and work faithfully with relevant prescriber(s) if any changes are desired or seem indicated. Crisis service -- Aware of call list and  work-in appts.  Call the clinic on-call service, 988/hotline, 911, or present to [facility] or ER if any life-threatening psychiatric crisis. Followup -- Return for time as already scheduled.  Next scheduled visit with me 09/25/2023.  Next scheduled in this office 09/25/2023.  Maretta Shaper, PhD Michelle Ferry, PhD LP Clinical Psychologist, [facility] [address], [address] [place], [place] [address] [phone]

## 2023-09-25 ENCOUNTER — Ambulatory Visit: Payer: 59 | Admitting: Psychiatry

## 2023-09-25 DIAGNOSIS — F411 Generalized anxiety disorder: Secondary | ICD-10-CM | POA: Diagnosis not present

## 2023-09-25 DIAGNOSIS — F341 Dysthymic disorder: Secondary | ICD-10-CM

## 2023-09-25 DIAGNOSIS — F5089 Other specified eating disorder: Secondary | ICD-10-CM

## 2023-09-25 NOTE — Progress Notes (Signed)
 Psychotherapy Progress Note [facility] Delora Ferry, PhD LP  Patient ID: Michelle Haynes)    MRN: [id number] Therapy format: Individual psychotherapy Date: 09/25/2023      Start: 6:05p     Stop: 6:55p     Time Spent: 50 min Location: Telehealth visit -- I connected with this patient by an approved telecommunication method (video), with her informed consent, and verifying identity and patient privacy.  I was located at my office and patient at her home.  As needed, we discussed the limitations, risks, and security and privacy concerns associated with telehealth service, including the availability and conditions which currently govern in-person appointments and the possibility that 3rd-party payment may not be fully guaranteed and she may be responsible for charges.  After she indicated understanding, we proceeded with the session.  Also discussed treatment planning, as needed, including ongoing verbal agreement with the plan, the opportunity to ask and answer all questions, her demonstrated understanding of instructions, and her readiness to call the office should symptoms worsen or she feels she is in a crisis state and needs more immediate and tangible assistance.   Session narrative (presenting needs, interim history, self-report of stressors and symptoms, applications of prior therapy, status changes, and interventions made in session) Got back from [place], hours before the 100-year snowstorm hit.  Enjoyed greatly, with a group of 10 friends, friends of friends, and sister.  Moderate alcohol, lovely food, interesting sights to visit.  Weathered a few illnesses, plus one friend who got food poisoning, none of which impinged on Michelle Haynes.  Pulled off her goal of showing sister a truly good time, managed her own tendency to inadvertently come off bossy, and helped put harshness in the past.  Passed her own test of being mindful and gracious.  Affirmed and encouraged.  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  Enjoying some reading on management recommended by friend Michelle Haynes, not exactly related.  Dream scenario would be to work for a Museum/gallery exhibitions officer in [place], but on a more stable basis.  Affirmed and encouraged.  Michelle Haynes had his endoscopy -- no cancer, as noted, but Jan 30 followup to assess his erosive esophagitis.  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  May go see him in the next couple weeks.  Continue to support approaching him supportively, leaving off challenging commentary.  Getting a little easier to do.  Found she did worry a lot about stomach issues herself in [place], used Xanax  frequently, along with Benefiber and Imodium .  Dosing was still small, e.g., a half tab, but powerfully reassuring to know she has it on board if she really needs it.  Admits she is all but posttraumatic about her nervous stomach, which itself is essentially IBS-D, but very self-conscious and prone to see it as catastrophic or shaming.  Encouraged notice and reframe as a problem to work, like everything else.  Semaglutide  was constipating, but now it's loosening up, so she is using Benefiber prophylactically.    Discussed lessons taken from how she managed the trip, including GI management.  Given anxiety component, oriented her to alternative relaxation techniques, including a conspicuously expressive expressive breathing technique Fredirick Jasmine....shiiiit" breathing, which she enjoyed).  Therapeutic modalities: Cognitive Behavioral Therapy, Solution-Oriented/Positive Psychology, and Ego-Supportive  Mental Status/Observations:  Appearance:   Casual     Behavior:  Appropriate  Motor:  Normal  Speech/Language:   Clear and Coherent  Affect:  Appropriate  Mood:  anxious  and less  Thought process:  normal  Thought content:    WNL  Sensory/Perceptual disturbances:    WNL  Orientation:  Fully oriented  Attention:  Good     Concentration:  Good  Memory:  WNL  Insight:    Good  Judgment:   Good  Impulse Control:  Variable   Risk Assessment: Danger to Self: No Self-injurious Behavior: No Danger to Others: No Physical Aggression / Violence: No Duty to Warn: No Access to Firearms a concern: No  Assessment of progress:  progressing  Diagnosis:   ICD-10-CM   1. Persistent depressive disorder  F34.1     2. Generalized anxiety disorder  F41.1     3. Compulsive overeating  F50.89      Plan:  Social involvement/roots -- Maintain church involvement, despite possibility of pulling up and moving.  Allow herself to join up with groups or ministries of choice.  Build out from healed friendship with Michelle Haynes in other ways as available. Weight mgmt -- Endorse continued GLP-1, as tolerated and affordable.  Continue looking for ways to do small commitments in exercise -- 10-minute video, for one.  Continue practicing restraint and healthful variety in food, esp using Weight Watchers tools she is familiar with.  Best limit alcohol. Social anxiety -- Practice trust in friends to be discreet, understanding, and available, and be ready to acknowledge complaints if any actually come up.  Manage anger to reduce likelihood of alienating.  As body image is a potent concern, practice accepting "good enough" in any way possible. Mood management and self-esteem -- Journal ad lib, note any priorities to process and any thought patterns that may tend to exacerbate distress or dysphoria, especially shaming.  Use slogan "The beating drives the eating; no beating required."  Monitor for compulsive drinking.  Monitor for any need to process intense memories or regrets.   Sleep -- Observe limits on melatonin and don't expect it to force sleep; dose 3-10 mg at discretion 1-2 hrs before intended bedtime and practice sleep hygiene measures.  Try out amber lenses as a more natural way.  Pay attention to conscious consent to sleep and truly  settling regrets and worries. Spiritual concerns -- Let it be an open question whether God has ways she does not know for reaching her son, others, and prioritize showing rather grace over preaching. Family concerns -- Continue to show interest, voice support, refrain from surprise criticism with son Michelle Haynes.  If confrontation needed, get an acceptance first to have  difficult conversation. Other recommendations/advice -- As may be noted above.  Continue to utilize previously learned skills ad lib. Medication compliance -- Maintain medication as prescribed and work faithfully with relevant prescriber(s) if any changes are desired or seem indicated. Crisis service -- Aware of call list and work-in appts.  Call the clinic on-call service, 988/hotline, 911, or present to [facility] or ER if any life-threatening psychiatric crisis. Followup -- Return for time as already scheduled.  Next scheduled visit with me 10/09/2023.  Next scheduled in this office 10/09/2023.  Maretta Shaper, PhD Delora Ferry, PhD LP Clinical Psychologist, [facility] [address], [address] [place], [place] [address] [phone]

## 2023-10-09 ENCOUNTER — Ambulatory Visit: Payer: 59 | Admitting: Psychiatry

## 2023-10-15 DIAGNOSIS — L720 Epidermal cyst: Secondary | ICD-10-CM | POA: Diagnosis not present

## 2023-10-15 DIAGNOSIS — D2272 Melanocytic nevi of left lower limb, including hip: Secondary | ICD-10-CM | POA: Diagnosis not present

## 2023-10-15 DIAGNOSIS — L821 Other seborrheic keratosis: Secondary | ICD-10-CM | POA: Diagnosis not present

## 2023-10-15 DIAGNOSIS — D225 Melanocytic nevi of trunk: Secondary | ICD-10-CM | POA: Diagnosis not present

## 2023-10-15 DIAGNOSIS — I788 Other diseases of capillaries: Secondary | ICD-10-CM | POA: Diagnosis not present

## 2023-10-15 DIAGNOSIS — D1801 Hemangioma of skin and subcutaneous tissue: Secondary | ICD-10-CM | POA: Diagnosis not present

## 2023-10-15 DIAGNOSIS — L738 Other specified follicular disorders: Secondary | ICD-10-CM | POA: Diagnosis not present

## 2023-10-15 DIAGNOSIS — L814 Other melanin hyperpigmentation: Secondary | ICD-10-CM | POA: Diagnosis not present

## 2023-10-15 DIAGNOSIS — D2271 Melanocytic nevi of right lower limb, including hip: Secondary | ICD-10-CM | POA: Diagnosis not present

## 2023-10-25 ENCOUNTER — Ambulatory Visit: Payer: 59 | Admitting: Psychiatry

## 2023-11-06 ENCOUNTER — Other Ambulatory Visit (HOSPITAL_COMMUNITY): Payer: Self-pay

## 2023-11-06 ENCOUNTER — Ambulatory Visit: Payer: 59 | Admitting: Psychiatry

## 2023-11-06 DIAGNOSIS — Z01419 Encounter for gynecological examination (general) (routine) without abnormal findings: Secondary | ICD-10-CM | POA: Diagnosis not present

## 2023-11-06 DIAGNOSIS — S3023XA Contusion of vagina and vulva, initial encounter: Secondary | ICD-10-CM | POA: Diagnosis not present

## 2023-11-06 MED ORDER — ESTRADIOL 0.1 MG/GM VA CREA
1.0000 | TOPICAL_CREAM | Freq: Two times a day (BID) | VAGINAL | 0 refills | Status: AC
  Filled 2023-11-06: qty 42.5, 30d supply, fill #0

## 2023-11-20 ENCOUNTER — Ambulatory Visit: Payer: 59 | Admitting: Psychiatry

## 2023-12-04 ENCOUNTER — Ambulatory Visit (INDEPENDENT_AMBULATORY_CARE_PROVIDER_SITE_OTHER): Payer: Commercial Managed Care - PPO | Admitting: Psychiatry

## 2023-12-04 DIAGNOSIS — F5089 Other specified eating disorder: Secondary | ICD-10-CM

## 2023-12-04 DIAGNOSIS — F411 Generalized anxiety disorder: Secondary | ICD-10-CM | POA: Diagnosis not present

## 2023-12-04 DIAGNOSIS — F341 Dysthymic disorder: Secondary | ICD-10-CM

## 2023-12-04 NOTE — Progress Notes (Signed)
 Psychotherapy Progress Note [facility] Delora Ferry, PhD LP  Patient ID: Michelle Haynes)    MRN: [id number] Therapy format: Individual psychotherapy Date: 12/04/2023      Start: 6:12p     Stop: 7:00p     Time Spent: 48 min Location: In-person   Session narrative (presenting needs, interim history, self-report of stressors and symptoms, applications of prior therapy, status changes, and interventions made in session) 1st visit in a bout 10 wks, following a few cancellations by both Pt and Tx.  Been to [facility] since then.  Both [name] and [name] have lost their jobs, hers due to [employer] funds being cancelled by the administration, he to an intolerable work environment and the need to focus on home reno to turn over a house.  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  Supportively challenged to let the younger adults work through it as adults and just validate them best she can, solve problems if asked.  Personally, been having waves of depression, largely for facing too much anxiety.  Feels she may be time to go on medication to quiet the feelings enough.  Discussed options and referrals.  Low cost option for semaglutide  supply will be drying up, since the FDA has ruled it is no longer scarce.  Now 10 lbs from her basic goal, with hope of maybe just finishing out and maintaining without expensive chemistry.  Re work, has felt need to look for other positions to improve income.  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  Hopeful of finding fellow students to study with.  Hopeful for this work opportunity of being around more Pension scheme manager, too.  Felt good about her interview, and found herself applying wisdom gained in prior jobs.  Affirmed and encouraged.  Grievances noted about health insurance carrier and what she  sees as a lot of nickel and diming.  Support/validation provided.   Back to anxiety and intrusive thoughts she considers OCD, oriented to two worry control tactics.  Therapeutic modalities: Cognitive Behavioral Therapy, Solution-Oriented/Positive Psychology, Environmental manager, and Faith-sensitive  Mental Status/Observations:  Appearance:   Casual     Behavior:  Appropriate  Motor:  Normal  Speech/Language:   Clear and Coherent  Affect:  Appropriate  Mood:  anxious and dysthymic  Thought process:  normal  Thought content:    WNL  Sensory/Perceptual disturbances:    WNL  Orientation:  Fully oriented  Attention:  Good    Concentration:  Good  Memory:  WNL  Insight:    Good  Judgment:   Good  Impulse Control:  Good   Risk Assessment: Danger to Self: No Self-injurious Behavior: No Danger to Others: No Physical Aggression / Violence: No Duty to Warn: No Access to Firearms a concern: No  Assessment of progress:  progressing  Diagnosis:   ICD-10-CM   1. Persistent depressive disorder  F34.1     2. Generalized anxiety disorder  F41.1     3. Compulsive overeating  F50.89      Plan:  Social involvement/roots -- Maintain church involvement, despite possibility of pulling up and moving.  Allow herself to join up with groups or ministries of choice.  Build out from healed friendship with [name] in other ways as available. Food and weight mgmt -- Endorse continued GLP-1, as tolerated and affordable.  Continue looking for ways  to do small commitments in exercise -- 10-minute video, for one.  Continue practicing restraint and healthful variety in food, esp using Weight Watchers tools she is familiar with.  Cont limit alcohol. Social anxiety -- Practice trust in friends to be discreet, understanding, and available, and be ready to acknowledge complaints if any actually come up.  Manage anger to reduce likelihood of alienating.  As body image is a potent concern, practice accepting "good  enough" in any way possible. Worry -- Practice trust in relatives to manage.  For intrusive thoughts, try asking if the problem that comes up is truly likely and truly unknown what to do if a concern comes up (answer the question) and practice deciding when enough problem solving is enough and imagine how her own actions, other forces, or God would address the problem. Mood management and self-esteem -- Journal ad lib, note any priorities to process and any thought patterns that may tend to exacerbate distress or dysphoria, especially shaming.  Use slogan "The beating drives the eating; no beating required."  Monitor for compulsive drinking.  Monitor for any need to process intense memories or regrets.   Sleep -- Observe limits on melatonin and don't expect it to force sleep; dose 3-10 mg at discretion 1-2 hrs before intended bedtime and practice sleep hygiene measures.  Try out amber lenses as a more natural way.  Pay attention to conscious consent to sleep and truly settling regrets and worries. Spiritual concerns -- Let it be an open question whether God has ways she does not know for reaching her son, others, and prioritize showing rather grace over preaching. Family concerns -- Continue to show interest, voice support, refrain from surprise criticism with son [name].  If confrontation needed, get an acceptance first to have a difficult conversation. Other recommendations/advice -- As may be noted above.  Continue to utilize previously learned skills ad lib. Medication compliance -- Maintain medication as prescribed and work faithfully with relevant prescriber(s) if any changes are desired or seem indicated. Crisis service -- Aware of call list and work-in appts.  Call the clinic on-call service, 988/hotline, 911, or present to [facility] or ER if any life-threatening psychiatric crisis. Followup -- Return for time as already scheduled.  Next scheduled visit with me 12/19/2023.  Next scheduled in this office  12/19/2023.  Maretta Shaper, PhD Delora Ferry, PhD LP Clinical Psychologist, [facility] [address] [place], [place] [address] [phone]

## 2023-12-09 ENCOUNTER — Other Ambulatory Visit: Payer: Self-pay | Admitting: Internal Medicine

## 2023-12-09 DIAGNOSIS — H5213 Myopia, bilateral: Secondary | ICD-10-CM | POA: Diagnosis not present

## 2023-12-09 DIAGNOSIS — R7401 Elevation of levels of liver transaminase levels: Secondary | ICD-10-CM

## 2023-12-18 ENCOUNTER — Ambulatory Visit: Payer: Commercial Managed Care - PPO | Admitting: Psychiatry

## 2023-12-19 ENCOUNTER — Ambulatory Visit (INDEPENDENT_AMBULATORY_CARE_PROVIDER_SITE_OTHER): Admitting: Psychiatry

## 2023-12-19 DIAGNOSIS — F411 Generalized anxiety disorder: Secondary | ICD-10-CM

## 2023-12-19 DIAGNOSIS — F341 Dysthymic disorder: Secondary | ICD-10-CM

## 2023-12-19 DIAGNOSIS — F5089 Other specified eating disorder: Secondary | ICD-10-CM | POA: Diagnosis not present

## 2023-12-19 DIAGNOSIS — Z62820 Parent-biological child conflict: Secondary | ICD-10-CM

## 2023-12-19 NOTE — Progress Notes (Signed)
 Psychotherapy Progress Note [facility] Delora Ferry, PhD LP  Patient ID: Michelle Haynes)    MRN: [id number] Therapy format: Individual psychotherapy Date: 12/19/2023      Start: 5:15p     Stop: 6:05p     Time Spent: 50 min Location: In-person   Session narrative (presenting needs, interim history, self-report of stressors and symptoms, applications of prior therapy, status changes, and interventions made in session) Been mulling over the meaning of Holy Week.  Disappointed to not get the job she went for.  One reason may have been foreshortened interview and technical difficulties, but considering that maybe it's just God's will, and she'll get there PMP certification first.  Coaxed to entertain the idea, per her faith, that God has a better path in mind, yet to be revealed.  Juggling expenses, with car maintenance and possible certification course to pay for.  Good news with new renter arriving, who may be more durable, and still spend several days at a time at her actual home in [facility], for the best of both.  [name] and [name] have not listed their house yet, still working on questionable renovations, and Liv's worry they are both putting off the next, more needed task of job-finding.  Also worried about [name] relapsing in smoking and sodas, despite the great wakeup call he had with his heart attack at 63yo, and the more recent wakeup call with erosive esophagitis.  Support provided, reviewed values and tactics for helping him without seeming to criticize or dominate.  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  Affirmed and encouraged.  Concern she's also developing fibromyalgia as well, given a set of aches and pains.  Therapeutic modalities: Cognitive Behavioral Therapy, Solution-Oriented/Positive Psychology, Environmental manager, and Faith-sensitive  Mental  Status/Observations:  Appearance:   Casual     Behavior:  Appropriate  Motor:  Normal  Speech/Language:   Clear and Coherent  Affect:  Appropriate  Mood:  anxious and less  Thought process:  normal  Thought content:    WNL  Sensory/Perceptual disturbances:    WNL  Orientation:  Fully oriented  Attention:  Good    Concentration:  Good  Memory:  WNL  Insight:    Good  Judgment:   Good  Impulse Control:  Variable   Risk Assessment: Danger to Self: No Self-injurious Behavior: No Danger to Others: No Physical Aggression / Violence: No Duty to Warn: No Access to Firearms a concern: No  Assessment of progress:  progressing  Diagnosis:   ICD-10-CM   1. Persistent depressive disorder  F34.1     2. Generalized anxiety disorder  F41.1     3. Compulsive overeating  F50.89     4. Relationship problem between parent and adult child  Z62.820      Plan:  Social involvement/roots -- Maintain church involvement, despite possibility of pulling up and moving.  Allow herself to join up with groups or ministries of choice.  Build out from healed friendship with [name] in other ways as available. Food and weight mgmt -- Endorse continued GLP-1, as tolerated and affordable.  Continue looking for ways to do small commitments in exercise -- 10-minute video, for one.  Continue practicing restraint and healthful variety in food, esp using Weight Watchers tools she is familiar with.  Cont limit alcohol. Social anxiety -- Practice trust in friends to be discreet, understanding, and available, and be ready to acknowledge complaints  if any actually come up.  Manage anger to reduce likelihood of alienating.  As body image is a potent concern, practice accepting "good enough" in any way possible. Worry -- Practice trust in relatives to manage.  For intrusive thoughts, try asking if the problem that comes up is truly likely and truly unknown what to do if a concern comes up (answer the question) and  practice deciding when enough problem solving is enough and imagine how her own actions, other forces, or God would address the problem. Mood management and self-esteem -- Journal ad lib, note any priorities to process and any thought patterns that may tend to exacerbate distress or dysphoria, especially shaming.  Use slogan "The beating drives the eating; no beating required."  Monitor for compulsive drinking.  Monitor for any need to process intense memories or regrets.   Sleep -- Observe limits on melatonin and don't expect it to force sleep; dose 3-10 mg at discretion 1-2 hrs before intended bedtime and practice sleep hygiene measures.  Try out amber lenses as a more natural way.  Pay attention to conscious consent to sleep and truly settling regrets and worries. Spiritual concerns -- Let it be an open question whether God has ways she does not know for reaching her son, others, and prioritize showing rather grace over preaching. Family concerns -- Continue to show interest, voice support, refrain from surprise criticism with son [name].  If confrontation needed, get an acceptance first to have a difficult conversation. Other recommendations/advice -- As may be noted above.  Continue to utilize previously learned skills ad lib. Medication compliance -- Maintain medication as prescribed and work faithfully with relevant prescriber(s) if any changes are desired or seem indicated. Crisis service -- Aware of call list and work-in appts.  Call the clinic on-call service, 988/hotline, 911, or present to [facility] or ER if any life-threatening psychiatric crisis. Followup -- Return for time as already scheduled.  Next scheduled visit with me 01/01/2024.  Next scheduled in this office 01/01/2024.  Maretta Shaper, PhD Delora Ferry, PhD LP Clinical Psychologist, [facility] [address] [place], [place] [address] [phone]

## 2023-12-23 ENCOUNTER — Other Ambulatory Visit (HOSPITAL_COMMUNITY): Payer: Self-pay

## 2023-12-23 MED ORDER — ESTRADIOL 0.1 MG/GM VA CREA
1.0000 | TOPICAL_CREAM | Freq: Two times a day (BID) | VAGINAL | 1 refills | Status: DC
  Filled 2023-12-23: qty 42.5, 30d supply, fill #0

## 2023-12-23 MED ORDER — ALPRAZOLAM 0.5 MG PO TABS
0.2500 mg | ORAL_TABLET | Freq: Every day | ORAL | 0 refills | Status: DC
  Filled 2023-12-23: qty 20, 20d supply, fill #0

## 2024-01-01 ENCOUNTER — Ambulatory Visit (INDEPENDENT_AMBULATORY_CARE_PROVIDER_SITE_OTHER): Payer: Commercial Managed Care - PPO | Admitting: Psychiatry

## 2024-01-01 DIAGNOSIS — F411 Generalized anxiety disorder: Secondary | ICD-10-CM

## 2024-01-01 DIAGNOSIS — F5089 Other specified eating disorder: Secondary | ICD-10-CM | POA: Diagnosis not present

## 2024-01-01 DIAGNOSIS — Z62820 Parent-biological child conflict: Secondary | ICD-10-CM | POA: Diagnosis not present

## 2024-01-01 DIAGNOSIS — F341 Dysthymic disorder: Secondary | ICD-10-CM

## 2024-01-01 NOTE — Progress Notes (Signed)
 Psychotherapy Progress Note [facility] Delora Ferry, PhD LP  Patient ID: Michelle Haynes)    MRN: [id number] Therapy format: Individual psychotherapy Date: 01/01/2024      Start: 5:14p     Stop: 6:05p     Time Spent: 51 min Location: In-person   Session narrative (presenting needs, interim history, self-report of stressors and symptoms, applications of prior therapy, status changes, and interventions made in session) Continued to feel skin sensitivity on a few places on right leg, subsiding, thankfully.  Sounds like postherpetic neuralgia as much as anything.  Has changed gynecologist and PCP, new doctor appts next 2 months.  Affirmed and encouraged in establishing reliable, trusted connections.  Re [name], a spat broke out after she tried to tell him not to expect the price he's asking for the house.  4/19 he balked at having to hear naysaying, uncharacteristically admitted high stress rehabbing the house; she soberly and sincerely apologized, understood, pledged to lay off.  No resentment, though it has been some days now since he last contacted, his habit is not to reach out anyway, and she is fighting feeling like she screwed up royally.  Strategized to give it till Mother's Day, ask about him then how things are going, let him lead where to go with it, and show up prepared to apologize, or recognize aloud that she did the unwanted advice thing he hates and it seemed to lead to a freeze.  New issue with sister -- offered for her to come down mid March, repeated it, but not getting a definitive answer.  Had a plan for her to visit a week, then it shrank to 3 days, then she enacted plans to see her daughter in [place] instead.  Got very angry momentarily, privately, and tightened up.  Got a breezy call from sister not so long after, took a lot of restraint not to lay into her.  Reviewed principles trying to communicate with her and invite, reframed reason to let her do as she  sees fit, but OK to ask her about the seeming setup to hope for seeing her, be willing to point to having counted on her rather than being angry.  Therapeutic modalities: Cognitive Behavioral Therapy, Solution-Oriented/Positive Psychology, Ego-Supportive, Faith-sensitive, and Assertiveness/Communication  Mental Status/Observations:  Appearance:   Casual     Behavior:  Appropriate  Motor:  Normal  Speech/Language:   Clear and Coherent  Affect:  Appropriate  Mood:  dysthymic  Thought process:  normal  Thought content:    Rumination  Sensory/Perceptual disturbances:    WNL  Orientation:  Fully oriented  Attention:  Good    Concentration:  Good  Memory:  WNL  Insight:    Good  Judgment:   Good  Impulse Control:  Variable   Risk Assessment: Danger to Self: No Self-injurious Behavior: No Danger to Others: No Physical Aggression / Violence: No Duty to Warn: No Access to Firearms a concern: No  Assessment of progress:  progressing  Diagnosis:   ICD-10-CM   1. Persistent depressive disorder  F34.1     2. Generalized anxiety disorder  F41.1     3. Compulsive overeating  F50.89     4. Relationship problem between parent and adult child  Z62.820      Plan:  Social involvement/roots -- Maintain church involvement, despite possibility of pulling up and moving.  Allow herself to join up with groups or ministries of choice.  Build out from healed friendship with [name] in  other ways as available. Food and weight mgmt -- Endorse continued GLP-1, as tolerated and affordable.  Continue looking for ways to do small commitments in exercise -- 10-minute video, for one.  Continue practicing restraint and healthful variety in food, esp using Weight Watchers tools she is familiar with.  Cont limit alcohol. Social anxiety -- Practice trust in friends to be discreet, understanding, and available, and be ready to acknowledge complaints if any actually come up.  Manage anger to reduce likelihood of  alienating.  As body image is a potent concern, practice accepting "good enough" in any way possible. Worry -- Practice trust in relatives to manage.  For intrusive thoughts, try asking if the problem that comes up is truly likely and truly unknown what to do if a concern comes up (answer the question) and practice deciding when enough problem solving is enough and imagine how her own actions, other forces, or God would address the problem. Mood management and self-esteem -- Journal ad lib, note any priorities to process and any thought patterns that may tend to exacerbate distress or dysphoria, especially shaming.  Use slogan "The beating drives the eating; no beating required."  Monitor for compulsive drinking.  Monitor for any need to process intense memories or regrets.   Sleep -- Observe limits on melatonin and don't expect it to force sleep; dose 3-10 mg at discretion 1-2 hrs before intended bedtime and practice sleep hygiene measures.  Try out amber lenses as a more natural way.  Pay attention to conscious consent to sleep and truly settling regrets and worries. Spiritual concerns -- Let it be an open question whether God has ways she does not know for reaching her son, others, and prioritize showing rather grace over preaching. Family concerns -- Continue to show interest, voice support, refrain from surprise criticism with son [name].  If confrontation needed, get an acceptance first to have a difficult conversation. Other recommendations/advice -- As may be noted above.  Continue to utilize previously learned skills ad lib. Medication compliance -- Maintain medication as prescribed and work faithfully with relevant prescriber(s) if any changes are desired or seem indicated. Crisis service -- Aware of call list and work-in appts.  Call the clinic on-call service, 988/hotline, 911, or present to [facility] or ER if any life-threatening psychiatric crisis. Followup -- Return for time as already scheduled.   Next scheduled visit with me 01/15/2024.  Next scheduled in this office 01/15/2024.  Maretta Shaper, PhD Delora Ferry, PhD LP Clinical Psychologist, [facility] [address] [place], [place] [address] [phone]

## 2024-01-15 ENCOUNTER — Ambulatory Visit (INDEPENDENT_AMBULATORY_CARE_PROVIDER_SITE_OTHER): Payer: 59 | Admitting: Psychiatry

## 2024-01-15 DIAGNOSIS — Z62820 Parent-biological child conflict: Secondary | ICD-10-CM

## 2024-01-15 DIAGNOSIS — F341 Dysthymic disorder: Secondary | ICD-10-CM | POA: Diagnosis not present

## 2024-01-15 DIAGNOSIS — F411 Generalized anxiety disorder: Secondary | ICD-10-CM | POA: Diagnosis not present

## 2024-01-15 DIAGNOSIS — F5089 Other specified eating disorder: Secondary | ICD-10-CM | POA: Diagnosis not present

## 2024-01-15 NOTE — Progress Notes (Signed)
 Psychotherapy Progress Note [facility] Delora Ferry, PhD LP  Patient ID: Michelle Haynes)    MRN: [id number] Therapy format: Individual psychotherapy Date: 01/15/2024      Start: 5:08p     Stop: 6:08p     Time Spent: 60 min Location: In-person   Session narrative (presenting needs, interim history, self-report of stressors and symptoms, applications of prior therapy, status changes, and interventions made in session) Irritations with current administration for many things already, but now got dealt hard by the IRS on a delinquent tax bill.  Anxious and irritated, but knows she has no choice but to work out a Insurance claims handler.  Notes she is interested now in getting a beta blocker for anxiety.  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  Would want to use it situationally, wants to know if Tx can ring up "the doctor" here to get her a prescription.  Educated on procedure and arranged referral to Dr. Mozingo, who might have first availability and probably be a good fit.  Disappointed not to be able to save money by soliciting a "ghost" scrip (my term) but understands the legalities and principles when shown.  Been putting communication advice to use approaching sister and son, lowering the nonverbals that signal pressure, like uninvited probing or uninvited advice, and it's working well.  Both are back in touch now on what seems to be friendly, more relaxed terms.  Concern still for [name], who is relapsed in anxiety, caffeine, and smoking, as feared, despite his hx of heart attack at 34yo and more recent erosive esophagitis.  Valid concern he could be courting an ulcer and GI cancer eventually.  Reviewed best practices for approaching him about her concerns.  Been a good epiphany, actually, to find out how anxious her son is, under the surface, given the mystery he has been before, seemingly just stubborn and  standoffish.  Also illuminating to understand how her well-intentioned press to get involved, provide information, give advice, and make points of faith would all come across as pressure.  Much more mindful now, and seeing the benefits of the softer approach, even if it doesn't just come naturally.  Continues to drink substantially less while on semaglutide .  Encouraged in making healthier eating and drinking habits, and forecasting how she might deal with stronger impulses when she weans.  Discussed preventive measures for relapse when she winds it down, including walking/exercise, expressive journaling.  Therapeutic modalities: Cognitive Behavioral Therapy, Solution-Oriented/Positive Psychology, Environmental manager, and Faith-sensitive  Mental Status/Observations:  Appearance:   Casual     Behavior:  Appropriate  Motor:  Normal  Speech/Language:   Clear and Coherent  Affect:  Negative  Mood:  Less anxious  Thought process:  normal  Thought content:    WNL  Sensory/Perceptual disturbances:    WNL  Orientation:  Fully oriented  Attention:  Good    Concentration:  Good  Memory:  WNL  Insight:    Good  Judgment:   Good  Impulse Control:  Variable   Risk Assessment: Danger to Self: No Self-injurious Behavior: No Danger to Others: No Physical Aggression / Violence: No Duty to Warn: No Access to Firearms a concern: No  Assessment of progress:  progressing  Diagnosis:   ICD-10-CM   1. Persistent depressive disorder  F34.1     2. Generalized anxiety disorder  F41.1     3. Compulsive overeating  F50.89  4. Relationship problem between parent and adult child  Z62.820      Plan:  Social involvement/roots -- Maintain church involvement, despite possibility of pulling up and moving.  Allow herself to join up with groups or ministries of choice.  Build out from healed friendship with [name] in other ways as available. Food and weight mgmt -- Endorse continued GLP-1, as tolerated and  affordable.  Continue looking for ways to do small commitments in exercise -- 10-minute video, for one.  Continue practicing restraint and healthful variety in food, esp using Weight Watchers tools she is familiar with.  Cont limit alcohol. Social anxiety -- Practice trust in friends to be discreet, understanding, and available, and be ready to acknowledge complaints if any actually come up.  Manage anger to reduce likelihood of alienating.  As body image is a potent concern, practice accepting "good enough" in any way possible. Worry -- Practice trust in relatives to manage.  For intrusive thoughts, try asking if the problem that comes up is truly likely and truly unknown what to do if a concern comes up (answer the question) and practice deciding when enough problem solving is enough and imagine how her own actions, other forces, or God would address the problem. Mood management and self-esteem -- Journal ad lib, note any priorities to process and any thought patterns that may tend to exacerbate distress or dysphoria, especially shaming.  Use slogan "The beating drives the eating; no beating required."  Monitor for compulsive drinking.  Monitor for any need to process intense memories or regrets.   Sleep -- Observe limits on melatonin and don't expect it to force sleep; dose 3-10 mg at discretion 1-2 hrs before intended bedtime and practice sleep hygiene measures.  Try out amber lenses as a more natural way.  Pay attention to conscious consent to sleep and truly settling regrets and worries. Spiritual concerns -- Let it be an open question whether God has ways she does not know for reaching her son, others, and prioritize showing rather grace over preaching. Family concerns -- Continue to show interest, voice support, refrain from surprise criticism with son [name].  If confrontation needed, get an acceptance first to have a difficult conversation. Other recommendations/advice -- As may be noted above.   Continue to utilize previously learned skills ad lib. Medication compliance -- Maintain medication as prescribed and work faithfully with relevant prescriber(s) if any changes are desired or seem indicated. Crisis service -- Aware of call list and work-in appts.  Call the clinic on-call service, 988/hotline, 911, or present to [facility] or ER if any life-threatening psychiatric crisis. Followup -- Return for time as already scheduled.  Next scheduled visit with me 02/05/2024.  Next scheduled in this office 02/05/2024.  Maretta Shaper, PhD Delora Ferry, PhD LP Clinical Psychologist, [facility] [address], [address] [place], [place] [address] [phone]

## 2024-01-30 NOTE — Progress Notes (Incomplete)
 Psychotherapy Progress Note [facility] Delora Ferry, PhD LP  Patient ID: Michelle Haynes)    MRN: [id number] Therapy format: Individual psychotherapy Date: 01/15/2024      Start: 5:08p     Stop: 6:08p     Time Spent: 60 min Location: In-person   Session narrative (presenting needs, interim history, self-report of stressors and symptoms, applications of prior therapy, status changes, and interventions made in session) Irritations with current administration, for many things, but now got dealt hard by the IRS on  delinquent tax bill.    Interest in a beta blocker for anxiety.  [name] just went on one during a   Been putting advice to use approaching sister and son, lowering the nonverbals that signal pressure, like univited probing or uninvited advice, and it's working well.  Both back in touch.  Concern still for Michelle Haynes, who is relapsed in anxiety, caffeine, and smoking, and has hx of heart attack at 63yo.  Valid concern he could be courting ulcer and GI cancer eventually.  Been a good ephpihany to find out how anxious her son is, under the surface, as well as how her getting involved and providing information, advice, and faith points would come across as pressure.    Continues to drink substnatially less while on semaglutide  Discussed preventive measures for relapse she winds it down, including walking/exercise.    Therapeutic modalities: {AM:23362::"Cognitive Behavioral Therapy","Solution-Oriented/Positive Psychology"}  Mental Status/Observations:  Appearance:   {PSY:22683}     Behavior:  {PSY:21022743}  Motor:  {PSY:22302}  Speech/Language:   {PSY:22685}  Affect:  {PSY:22687}  Mood:  {PSY:31886}  Thought process:  {PSY:31888}  Thought content:    {[phone]}  Sensory/Perceptual disturbances:    {PSY:682-402-3811}  Orientation:  {Psych Orientation:23301::"Fully oriented"}  Attention:  {Good-Fair-Poor ratings:23770::"Good"}    Concentration:   {Good-Fair-Poor ratings:23770::"Good"}  Memory:  {PSY:667-098-1479}  Insight:    {Good-Fair-Poor ratings:23770::"Good"}  Judgment:   {Good-Fair-Poor ratings:23770::"Good"}  Impulse Control:  {Good-Fair-Poor ratings:23770::"Good"}   Risk Assessment: Danger to Self: {Risk:22599::"No"} Self-injurious Behavior: {Risk:22599::"No"} Danger to Others: {Risk:22599::"No"} Physical Aggression / Violence: {Risk:22599::"No"} Duty to Warn: {AMYesNo:22526::"No"} Access to Firearms a concern: {AMYesNo:22526::"No"}  Assessment of progress:  {Progress:22147::"progressing"}  Diagnosis:   ICD-10-CM   1. Persistent depressive disorder  F34.1     2. Generalized anxiety disorder  F41.1     3. Compulsive overeating  F50.89     4. Relationship problem between parent and adult child  Z62.820      Plan:  Social involvement/roots -- Maintain church involvement, despite possibility of pulling up and moving.  Allow herself to join up with groups or ministries of choice.  Build out from healed friendship with [name] in other ways as available. Food and weight mgmt -- Endorse continued GLP-1, as tolerated and affordable.  Continue looking for ways to do small commitments in exercise -- 10-minute video, for one.  Continue practicing restraint and healthful variety in food, esp using Weight Watchers tools she is familiar with.  Cont limit alcohol. Social anxiety -- Practice trust in friends to be discreet, understanding, and available, and be ready to acknowledge complaints if any actually come up.  Manage anger to reduce likelihood of alienating.  As body image is a potent concern, practice accepting "good enough" in any way possible. Worry -- Practice trust in relatives to manage.  For intrusive thoughts, try asking if the problem that comes up is truly likely and truly unknown what to do if a concern comes up (answer the question) and  practice deciding when enough problem solving is enough and imagine how her own  actions, other forces, or God would address the problem. Mood management and self-esteem -- Journal ad lib, note any priorities to process and any thought patterns that may tend to exacerbate distress or dysphoria, especially shaming.  Use slogan "The beating drives the eating; no beating required."  Monitor for compulsive drinking.  Monitor for any need to process intense memories or regrets.   Sleep -- Observe limits on melatonin and don't expect it to force sleep; dose 3-10 mg at discretion 1-2 hrs before intended bedtime and practice sleep hygiene measures.  Try out amber lenses as a more natural way.  Pay attention to conscious consent to sleep and truly settling regrets and worries. Spiritual concerns -- Let it be an open question whether God has ways she does not know for reaching her son, others, and prioritize showing rather grace over preaching. Family concerns -- Continue to show interest, voice support, refrain from surprise criticism with son Michelle Haynes.  If confrontation needed, get an acceptance first to have a difficult conversation. Other recommendations/advice -- As may be noted above.  Continue to utilize previously learned skills ad lib. Medication compliance -- Maintain medication as prescribed and work faithfully with relevant prescriber(s) if any changes are desired or seem indicated. Crisis service -- Aware of call list and work-in appts.  Call the clinic on-call service, 988/hotline, 911, or present to [facility] or ER if any life-threatening psychiatric crisis. Followup -- Return for time as already scheduled.  Next scheduled visit with me 02/05/2024.  Next scheduled in this office 02/05/2024.  Maretta Shaper, PhD Delora Ferry, PhD LP Clinical Psychologist, [facility] [address] [place], [place] [address] [phone]

## 2024-01-31 ENCOUNTER — Other Ambulatory Visit (HOSPITAL_COMMUNITY): Payer: Self-pay

## 2024-01-31 ENCOUNTER — Telehealth: Admitting: Adult Health

## 2024-01-31 ENCOUNTER — Encounter: Payer: Self-pay | Admitting: Adult Health

## 2024-01-31 VITALS — Ht 64.0 in

## 2024-01-31 DIAGNOSIS — F411 Generalized anxiety disorder: Secondary | ICD-10-CM

## 2024-01-31 MED ORDER — ROSUVASTATIN CALCIUM 20 MG PO TABS
20.0000 mg | ORAL_TABLET | Freq: Every evening | ORAL | 0 refills | Status: AC
  Filled 2024-01-31: qty 90, 90d supply, fill #0

## 2024-01-31 MED ORDER — PROPRANOLOL HCL 10 MG PO TABS
10.0000 mg | ORAL_TABLET | Freq: Three times a day (TID) | ORAL | 2 refills | Status: DC
  Filled 2024-01-31: qty 90, 30d supply, fill #0
  Filled 2024-04-09: qty 90, 30d supply, fill #1

## 2024-01-31 NOTE — Progress Notes (Signed)
 Virtual Visit via Video Note  I connected with pt @ on 01/31/24 at  1:00 PM EDT by a video enabled telemedicine application and verified that I am speaking with the correct person using two identifiers.   I discussed the limitations of evaluation and management by telemedicine and the availability of in person appointments. The patient expressed understanding and agreed to proceed.  I discussed the assessment and treatment plan with the patient. The patient was provided an opportunity to ask questions and all were answered. The patient agreed with the plan and demonstrated an understanding of the instructions.   The patient was advised to call back or seek an in-person evaluation if the symptoms worsen or if the condition fails to improve as anticipated.  I provided 45 minutes of non-face-to-face time during this encounter.  The patient was located at home.  The provider was located at [facility].   Reagan Camera, NP    [facility] MD[facility]PA/NP Initial Note  01/31/2024 2:53 PM Michelle Haynes  MRN:  [id number]  Chief Complaint:   HPI:  Patient seen today for initial psychiatric evaluation.   Describes mood today as "ok". Pleasant. Reports tearfulness. Mood symptoms - reports some depression, anxiety and irritability. Reports stable interest and motivation - "not able to do things she wants to do - GI issues". Reports panic attacks - when traveling - not having access to a bathroom and embarrassing herself. Reports worry, rumination and over thinking. Reports obsessive thoughts. Denies obsessive acts. Reports mood is variable. Stating "I feel like I'm doing ok some days and other days not". Reports taking current medications as prescribed. Would like to consider additional options for anxiety.  Energy levels stable. Active, has a regular exercise routine - 30 minutes 5 days a week.  Enjoys some usual interests and activities. Single. Lives alone. Has a 63 year old son. Spending time  with family and friends. Appetite adequate. Weight loss - 45 pounds - GLP-1. Reports difficulties getting to and staying asleep. Averages 7.5 to 8 hours. Focus and concentration stable. Completing tasks. Managing aspects of household. Works full-time Sales executive. Denies SI or HI.  Denies AH or VH. Denies self harm. Denies substance use.  Previous medication trials: History of antidepressants  Visit Diagnosis:    ICD-10-CM   1. Generalized anxiety disorder  F41.1 propranolol  (INDERAL ) 10 MG tablet      Past Psychiatric History:   Past Medical History:  Past Medical History:  Diagnosis Date   High cholesterol    Hypertension     Past Surgical History:  Procedure Laterality Date   AUGMENTATION MAMMAPLASTY Bilateral 2002   bilat implants behind muscle   BREAST SURGERY     LIGAMENT REPAIR Right 01/25/2022   Procedure: DELTOID LIGAMENT RECONSTRCUTION;  Surgeon: Donnamarie Gables, MD;  Location: [facility] OR;  Service: Orthopedics;  Laterality: Right;   SYNDESMOSIS REPAIR Right 01/25/2022   Procedure: OPEN TREATMENT OF RIGHT SYNDESMOSIS;  Surgeon: Donnamarie Gables, MD;  Location: [facility] OR;  Service: Orthopedics;  Laterality: Right;  LENGTH OF SURGERY: 59 MINUTES    Family Psychiatric History: Denies any family history of mental illness.   Family History:  Family History  Problem Relation Age of Onset   Stroke Mother    Depression Mother    Diabetes Father    Anxiety disorder Father     Social History:  Social History   Socioeconomic History   Marital status: Single    Spouse name: Not on file   Number  of children: 1   Years of education: Not on file   Highest education level: Not on file  Occupational History   Not on file  Tobacco Use   Smoking status: Never    Passive exposure: Never   Smokeless tobacco: Never  Vaping Use   Vaping status: Never Used  Substance and Sexual Activity   Alcohol use: Yes    Comment: occasional   Drug use: Never   Sexual activity: Not on  file  Other Topics Concern   Not on file  Social History Narrative   Not on file   Social Drivers of Health   Financial Resource Strain: Not on file  Food Insecurity: Not on file  Transportation Needs: Not on file  Physical Activity: Sufficiently Active (12/31/2023)   Received from [facility]   Exercise Vital Sign    Days of Exercise per Week: 5 days    Minutes of Exercise per Session: 30 min  Stress: Not on file  Social Connections: Not on file    Allergies:  Allergies  Allergen Reactions   Buspirone Nausea And Vomiting    from [facility]   Naltrexone-Bupropion Hcl Er Diarrhea    from [facility]   Squid Oil     squid (from [facility]rescripts)    Metabolic Disorder Labs: Lab Results  Component Value Date   HGBA1C 5.7 (H) 01/23/2022   MPG 116.89 01/23/2022   No results found for: "PROLACTIN" Lab Results  Component Value Date   CHOL 192 01/13/2021   TRIG 199 (A) 01/13/2021   HDL 52 01/13/2021   LDLCALC 100 01/13/2021   Lab Results  Component Value Date   TSH 0.74 01/13/2021    Therapeutic Level Labs: No results found for: "LITHIUM" No results found for: "VALPROATE" No results found for: "CBMZ"  Current Medications: Current Outpatient Medications  Medication Sig Dispense Refill   propranolol (INDERAL) 10 MG tablet Take 1 tablet (10 mg total) by mouth 3 (three) times daily. 90 tablet 2   ALPRAZolam  (XANAX ) 0.5 MG tablet Take 0.5-1 tablets (0.25-0.5 mg total) by mouth daily as needed for anxiety. 20 tablet 2   Ascorbic Acid (VITAMIN C PO) Take 1 tablet by mouth daily.     Cholecalciferol (VITAMIN D ) 125 MCG (5000 UT) CAPS Take 10,000-15,000 Units by mouth daily.     clobetasol  ointment (TEMOVATE ) 0.05 % Apply 1 application (a thin layer) topically to affected areas 2 (two) times daily. 60 g 0   COLLAGEN PO Take 6 tablets by mouth daily.     COVID-19 mRNA vaccine 2024-2025 (COMIRNATY ) syringe Inject 0.3 mLs into the muscle. 0.3 mL 0    cyclobenzaprine  (FLEXERIL ) 10 MG tablet Take 0.5-1 tablets (5-10 mg total) by mouth every 8 (eight) hours as needed for spasms. 30 tablet 0   diphenhydrAMINE  (BENADRYL ) 25 MG tablet Take 50 mg by mouth at bedtime.     estradiol  (ESTRACE  VAGINAL) 0.1 MG/GM vaginal cream Place a pea-sized amount to the external vagina 2 (two) times daily for 14 days. Then nightly 2-3 times a week. 42.5 g 1   hydroquinone  4 % cream Apply as directed to affected area once daily on hands. 28.35 g 2   ibuprofen (ADVIL) 200 MG tablet Take 400 mg by mouth every 6 (six) hours as needed for moderate pain or headache.     loperamide  (IMODIUM  A-D) 2 MG tablet Take 2 mg by mouth 4 (four) times daily as needed for diarrhea or loose stools.  meloxicam  (MOBIC ) 15 MG tablet Take 1 tablet by mouth once a day with food 30 tablet 1   rosuvastatin  (CRESTOR ) 20 MG tablet Take 1 tablet (20 mg total) by mouth at bedtime for cholesterol 90 tablet 0   Semaglutide -Weight Management 0.25 MG/0.5ML SOAJ Inject 0.25 mg into the skin once a week. 2 mL 0   Semaglutide -Weight Management 0.25 MG/0.5ML SOAJ Inject into the skin once a week. 2 mL 0   Semaglutide -Weight Management 0.25 MG/0.5ML SOAJ Inject 0.25 mg into the skin once a week. 2 mL 0   triamcinolone  cream (KENALOG ) 0.1 % Apply 1 Application topically 2 (two) times daily. 30 g 0   No current facility-administered medications for this visit.    Medication Side Effects: none  Orders placed this visit:  No orders of the defined types were placed in this encounter.   Psychiatric Specialty Exam:  Review of Systems  Musculoskeletal:  Negative for gait problem.  Neurological:  Negative for tremors.  Psychiatric/Behavioral:         Please refer to HPI    Height 5\' 4"  (1.626 m), last menstrual period 03/03/2018.Body mass index is 33.47 kg/m.  General Appearance: Casual and Neat  Eye Contact:  Good  Speech:  Clear and Coherent and Normal Rate  Volume:  Normal  Mood:  Euthymic   Affect:  Appropriate and Congruent  Thought Process:  Coherent and Descriptions of Associations: Intact  Orientation:  Full (Time, Place, and Person)  Thought Content: Logical   Suicidal Thoughts:  No  Homicidal Thoughts:  No  Memory:  WNL  Judgement:  Good  Insight:  Good  Psychomotor Activity:  Normal  Concentration:  Concentration: Good and Attention Span: Good  Recall:  Good  Fund of Knowledge: Good  Language: Good  Assets:  Communication Skills Desire for Improvement Financial Resources/Insurance Housing Intimacy Leisure Time Physical Health Resilience Social Support Talents/Skills Transportation Vocational/Educational  ADL's:  Intact  Cognition: WNL  Prognosis:  Good   Screenings:  GAD-7    Advertising copywriter from 09/27/2022 in [facility]  Total GAD-7 Score 14      PHQ2-9    Flowsheet Row Counselor from 09/27/2022 in [facility] Office Visit from 04/04/2021 in [facility] at [facility] Total Score 0 6  PHQ-9 Total Score -- 11      Flowsheet Row Pre-Admission Testing 62 from 01/23/2022 in [facility] PREADMISSION TEST[facility]  C-SSRS RISK CATEGORY No Risk       Receiving Psychotherapy: Yes   Treatment Plan/Recommendations:   Plan:  PDMP reviewed  Add Propranolol 10mg  TID prn anxiety  RTC 6 weeks  45 minutes spent dedicated to the care of this patient on the date of this encounter to include pre-visit review of records, ordering of medication, post visit documentation, and face-to-face time with the patient discussing GAD.  Discussed continuing current medication regimen.  Patient advised to contact office with any questions, adverse effects, or acute worsening in signs and symptoms.   Dayane Hillenburg N Jerrilyn Messinger, NP

## 2024-02-05 ENCOUNTER — Ambulatory Visit (INDEPENDENT_AMBULATORY_CARE_PROVIDER_SITE_OTHER): Payer: Self-pay | Admitting: Psychiatry

## 2024-02-05 DIAGNOSIS — Z91199 Patient's noncompliance with other medical treatment and regimen due to unspecified reason: Secondary | ICD-10-CM

## 2024-02-05 NOTE — Progress Notes (Signed)
 No-show/Short-notice cancellation note Delora Ferry, PhD, [facility]  Patient ID: Canary Fister     MRN: [id number]     Date: 02/07/2024     Appt time: 6pm  Pt not seen for 6pm in person as scheduled, the phone system is not live to take a call, no message from before 5pm, and staff locking the door shortly after 6pm, raising the possibility that she ran late but found herself locked out, which happened once before.  Direct call to Pt's number reached voicemail, and a direct text went unanswered through 30 minutes past start time.  Suspended decision to charge for noshow until better opportunity comes Thursday or Friday to hear out circumstances and to offer available make-up appointment.  MyChart message sent 6:30p to that effect.  As of 3pm on 6/6, no return messages from Pt acknowledging the miss or trying to work out alternative time.  Charge normally for NS.  Maretta Shaper, PhD Delora Ferry, PhD LP Clinical Psychologist, [facility] [address] [place], [place] [address] [phone]

## 2024-02-07 NOTE — Addendum Note (Signed)
 Addended by: Maretta Shaper A on: 02/07/2024 03:00 PM   Modules accepted: Level of Service

## 2024-02-19 ENCOUNTER — Ambulatory Visit (INDEPENDENT_AMBULATORY_CARE_PROVIDER_SITE_OTHER): Admitting: Psychiatry

## 2024-02-19 DIAGNOSIS — F5089 Other specified eating disorder: Secondary | ICD-10-CM

## 2024-02-19 DIAGNOSIS — Z638 Other specified problems related to primary support group: Secondary | ICD-10-CM | POA: Diagnosis not present

## 2024-02-19 DIAGNOSIS — F411 Generalized anxiety disorder: Secondary | ICD-10-CM | POA: Diagnosis not present

## 2024-02-19 DIAGNOSIS — F341 Dysthymic disorder: Secondary | ICD-10-CM

## 2024-02-19 DIAGNOSIS — R69 Illness, unspecified: Secondary | ICD-10-CM | POA: Diagnosis not present

## 2024-02-19 NOTE — Progress Notes (Signed)
 Psychotherapy Progress Note [facility] Jodie Kendall, PhD LP  Patient ID: Michelle Haynes)    MRN: [id number] Therapy format: Individual psychotherapy Date: 02/19/2024      Start: 5:02p     Stop: 5:50p     Time Spent: 48 min Location: In-person   Session narrative (presenting needs, interim history, self-report of stressors and symptoms, applications of prior therapy, status changes, and interventions made in session) Has met with Regina Mozingo, approved for PRN beta blocker.  Found her plenty approachable, even willing to cover a bridge scrip for a statin until she can get new PCP straightened out.  Finding, as intended, that propranolol  affords her more freedom form Xanax  and improved control over mainly performance anxiety.  Apologies for the missed appt, which was out of sight on her calendar, slipped her mind.    Notes she took a spill at work, with several bruises and abrasions to show.  Had a couple days of stiffness to work through.  More embarrassed, but it happened alone.    Has continued to lose weight as intended, still on GLP-1, still relieved of carb cravings as she had them.  Not calling S as much, and still on good terms with her.  Making July 4 plans, hopefully, with Sari to join up in [place], at [place] and [employer].  Glad to have taken care of it.    Worried about Chauncey and Isaiah trying to do FSBO and overpricing the house.  Chauncey did land a job, with a 35-min commute, which helps but taxes.  Inconclusive whether he stopped smoking again, still on caffeinated soda, but seems less driven and tense such as made her worry if he was courting another premature heart attack.  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  Gets lonely, still.  Good friends are distant, and much better able to afford travel than she.  Some hope of getting into [facility] groups as she  begins to present there.  Taking a community college course (in person) starting August.  Discussed options for finding other hangouts and activities where she might discover friend material.  Seems to be uncynical about them at this point.  Affirmed and encouraged.  With ongoing concern for finances, she is forgoing a trip to [place] and paying down debts instead.  Affirmed and encouraged in meeting priorities and, really, giving her future self the gift of being better taken care of.  Should help reduce anxiety and cynical turns later.  Therapeutic modalities: Cognitive Behavioral Therapy, Solution-Oriented/Positive Psychology, Environmental manager, and Faith-sensitive  Mental Status/Observations:  Appearance:   Casual     Behavior:  Appropriate  Motor:  Normal  Speech/Language:   Clear and Coherent  Affect:  Appropriate  Mood:  normal  Thought process:  normal  Thought content:    WNL  Sensory/Perceptual disturbances:    WNL  Orientation:  Fully oriented  Attention:  Good    Concentration:  Good  Memory:  WNL  Insight:    Good  Judgment:   Good  Impulse Control:  Good   Risk Assessment: Danger to Self: No Self-injurious Behavior: No Danger to Others: No Physical Aggression / Violence: No Duty to Warn: No Access to Firearms a concern: No  Assessment of progress:  progressing well  Diagnosis:   ICD-10-CM   1. 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  Persistent depressive disorder  F34.1     3. Compulsive overeating  F50.89     4. r/o PTSD (childhood abuse)  R69     5. Relationship problem with family member  Z63.8      Plan:  Social involvement/roots -- Maintain church involvement, despite possibility of pulling up and moving.  Allow herself to join up with groups or ministries of choice.  Build out from healed friendship with [name] in other ways as available. Food and weight mgmt -- Endorse continued GLP-1, as tolerated and affordable.  Continue  looking for ways to do small commitments in exercise -- 10-minute video, for one.  Continue practicing restraint and healthful variety in food, esp using Weight Watchers tools she is familiar with.  Cont limit alcohol. Social anxiety -- Practice trust in friends to be discreet, understanding, and available, and be ready to acknowledge complaints if any actually come up.  Manage anger to reduce likelihood of alienating.  As body image is a potent concern, practice accepting good enough for now in any way possible. Worry -- Practice trust in relatives to manage.  For intrusive thoughts, try asking if the problem that comes up is truly likely and truly unknown what to do if that concern comes up (i.e., answer the what if question).  Practice deciding when enough problem solving is enough also, and imagine how either her own actions, other forces, or God would address the problem in question. Mood management and self-esteem -- Journal ad lib, note any priorities to process and any thought patterns that may tend to exacerbate distress or dysphoria, especially shaming.  Use slogan The beating drives the eating; no beating required.  Monitor for compulsive drinking.  Monitor for any need to process intense memories or regrets.   Sleep -- Observe limits on melatonin and don't expect it to force sleep; dose 3-10 mg at discretion 1-2 hrs before intended bedtime and practice sleep hygiene measures.  Try out amber lenses as a more natural way.  Pay attention to conscious consent to sleep and truly settling regrets and worries. Spiritual concerns -- Let it be an open question whether God has ways she does not know for reaching those she gets concerned about, and prioritize showing rather grace over preaching with loved ones, recognizing it as nonverbal evangelism. Family concerns -- Continue to show interest, voice support, refrain from surprise criticism with son Chauncey.  If confrontation needed, get an acceptance  first to have a difficult conversation.  Continue to recruit trust with sister through nonreaction, warmth as she approaches, and willingness to help her break down assumptions (e.g., what she thought was silent treatment is taking pressure off and letting her decide, and what she thought may be irritation is actually missing her). Other recommendations/advice -- As may be noted above.  Continue to utilize previously learned skills ad lib. Medication compliance -- Maintain medication as prescribed and work faithfully with relevant prescriber(s) if any changes are desired or seem indicated. Crisis service -- Aware of call list and work-in appts.  Call the clinic on-call service, 988/hotline, 911, or present to [facility] or ER if any life-threatening psychiatric crisis. Followup -- Return for time as already scheduled.  Next scheduled visit with me 03/04/2024.  Next scheduled in this office 03/04/2024.  Lamar Kendall, PhD Jodie Kendall, PhD LP Clinical Psychologist, [facility] [address] [place], [place] [address] [phone]

## 2024-02-21 DIAGNOSIS — N951 Menopausal and female climacteric states: Secondary | ICD-10-CM | POA: Diagnosis not present

## 2024-02-21 DIAGNOSIS — N952 Postmenopausal atrophic vaginitis: Secondary | ICD-10-CM | POA: Diagnosis not present

## 2024-02-21 DIAGNOSIS — Z124 Encounter for screening for malignant neoplasm of cervix: Secondary | ICD-10-CM | POA: Diagnosis not present

## 2024-02-21 DIAGNOSIS — N8110 Cystocele, unspecified: Secondary | ICD-10-CM | POA: Diagnosis not present

## 2024-03-04 ENCOUNTER — Ambulatory Visit: Admitting: Psychiatry

## 2024-03-16 ENCOUNTER — Other Ambulatory Visit: Payer: Self-pay | Admitting: Obstetrics and Gynecology

## 2024-03-16 DIAGNOSIS — Z1231 Encounter for screening mammogram for malignant neoplasm of breast: Secondary | ICD-10-CM

## 2024-03-18 ENCOUNTER — Ambulatory Visit (INDEPENDENT_AMBULATORY_CARE_PROVIDER_SITE_OTHER): Admitting: Psychiatry

## 2024-03-18 DIAGNOSIS — F341 Dysthymic disorder: Secondary | ICD-10-CM

## 2024-03-18 DIAGNOSIS — Z638 Other specified problems related to primary support group: Secondary | ICD-10-CM

## 2024-03-18 DIAGNOSIS — F5089 Other specified eating disorder: Secondary | ICD-10-CM | POA: Diagnosis not present

## 2024-03-18 DIAGNOSIS — F411 Generalized anxiety disorder: Secondary | ICD-10-CM | POA: Diagnosis not present

## 2024-03-18 DIAGNOSIS — R69 Illness, unspecified: Secondary | ICD-10-CM

## 2024-03-18 NOTE — Progress Notes (Signed)
 Psychotherapy Progress Note [facility] Jodie Kendall, PhD LP  Patient ID: Michelle Haynes)    MRN: [id number] Therapy format: Individual psychotherapy Date: 03/18/2024      Start: 6:05p     Stop: 6:52p     Time Spent: 47 min Location: In-person   Session narrative (presenting needs, interim history, self-report of stressors and symptoms, applications of prior therapy, status changes, and interventions made in session) Discussed no show 6 wks ago and request to rescind fee.  Clarified process by which she missed her calendar reminder and worked out ways to prevent in the future.  Referral for psychiatric service began well, PRN propranolol  helpful so far.  Mostly pleasant visit to Chauncey ODESSIA Rake, but feels she's gotten sensitized to spending time among heathens, citing (mostly history of) hostile reactions to faith talk.  Claims to have edited herself more, trying to preserve open relationship, and accepts the idea that her witness is actually stronger when she avoids all hint of coercion, but nevertheless uneasy, largely for the nagging feeling that she will enable him/them to go unsaved.  Affirmed and encouraged in staying responsive to even hair-trigger objections given hx of alienation and suffering.  Good to have Sari come down, too.  BIL Garrel had his own insensitive moments, for un-remembered reasons, a couple times blurting out F [facility] Ena.  She was able to pass up without reacting, and figures he was actually a bit embarrassed after a bit.  No recollection what might have triggered him, suggested it is probably inconsequential.  Affirmed progress, however subtle, in seeing family members from whom she had been estranged, and putting up with intrusive feelings in order to do so.  Good development for Chauncey being told that he has a job with [employer] which he thought had evaporated due to the recruiter not returning calls.  Prospects to work up into an Art gallery manager capacity.   Affirmed and encouraged imagining Chauncey taking on his opportunity and doing better for himself.  Discussed relationship with S at some length, fielding a range of comments about what she probably thinks of her and how it's not as warm as it used to be.  Granted, used to be used to be several years older and taking care of sister in a dysfunctional home environment, never as close as she wants to remember it.  Cautioned against too many assumptions of rejection and imagining what she must think, encouraged to allow for it to be rather uneven who takes the initiative, because Cartier is constitutionally more motivated than most other people, so it never will be even.  Therapeutic modalities: Cognitive Behavioral Therapy, Solution-Oriented/Positive Psychology, Environmental manager, and Faith-sensitive  Mental Status/Observations:  Appearance:   Casual     Behavior:  Appropriate  Motor:  Normal  Speech/Language:   Clear and Coherent  Affect:  Appropriate  Mood:  dysthymic  Thought process:  normal  Thought content:    WNL  Sensory/Perceptual disturbances:    WNL  Orientation:  Fully oriented  Attention:  Good    Concentration:  Good  Memory:  WNL  Insight:    Good  Judgment:   Good  Impulse Control:  Variable   Risk Assessment: Danger to Self: No Self-injurious Behavior: No Danger to Others: No Physical Aggression / Violence: No Duty to Warn: No Access to Firearms a concern: No  Assessment of progress:  progressing  Diagnosis:   ICD-10-CM   1. Persistent depressive disorder  F34.1     2.  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 Compulsive overeating  F50.89    stable on GLP-1    4. Relationship problem with family member  Z63.8     5. r/o PTSD (childhood abuse)  R69      Plan:  Social involvement/roots -- Maintain church involvement, despite possibility of pulling up and moving.  Allow herself to join up with groups or ministries of choice.  Build out  from healed friendship with [name] in other ways as available. Food and weight mgmt -- Endorse continued GLP-1, as tolerated and affordable.  Continue looking for ways to do small commitments in exercise -- 10-minute video, for one.  Continue practicing restraint and healthful variety in food, esp using Weight Watchers tools she is familiar with.  Cont limit alcohol. Social anxiety -- Practice trust in friends to be discreet, understanding, and available, and be ready to acknowledge complaints if any actually come up.  Manage anger to reduce likelihood of alienating.  As body image is a potent concern, practice accepting good enough for now in any way possible.  Endorse situational use of beta blocker. Worry -- Practice trust in relatives to manage.  For intrusive thoughts, try asking if the problem that comes up is truly likely and truly unknown what to do if that concern comes up (i.e., answer the what if question).  Practice deciding when enough problem solving is enough also, and imagine how either her own actions, other forces, or God would address the problem in question. Mood management and self-esteem -- Journal ad lib, note any priorities to process and any thought patterns that may tend to exacerbate distress or dysphoria, especially shaming.  Use slogan The beating drives the eating; no beating required.  Monitor for compulsive drinking.  Monitor for any need to process intense memories or regrets.   Sleep -- Observe limits on melatonin and don't expect it to force sleep; dose 3-10 mg at discretion 1-2 hrs before intended bedtime and practice sleep hygiene measures.  Try out amber lenses as a more natural way.  Pay attention to conscious consent to sleep and truly settling regrets and worries. Spiritual concerns -- Let it be an open question whether God has ways she does not know for reaching those she gets concerned about.  Prioritize showing grace over preaching or imaginable coercion with  loved ones, recognizing how he reacts, and what peace she brings to it, as nonverbal evangelism, actually. Family concerns -- Continue to show interest, voice support, refrain from surprise criticism with son Chauncey.  If confrontation needed, get an acceptance first to have a difficult conversation.  Continue to recruit trust with sister through nonreaction, warmth as she approaches, and willingness to help her break down assumptions (e.g., what she thought was silent treatment is taking pressure off and letting her decide, and what she thought may be irritation is actually missing her). Other recommendations/advice -- As may be noted above.  Continue to utilize previously learned skills ad lib. Medication compliance -- Maintain medication as prescribed and work faithfully with relevant prescriber(s) if any changes are desired or seem indicated. Crisis service -- Aware of call list and work-in appts.  Call the clinic on-call service, 988/hotline, 911, or present to [facility] or ER if any life-threatening psychiatric crisis. Followup -- Return for time as already scheduled.  Next scheduled visit with me 04/08/2024.  Next scheduled in this office 03/24/2024.  Lamar Kendall, PhD Jodie Kendall, PhD LP  Clinical Psychologist, [facility] [address] [place], [place] [address] [phone]

## 2024-03-24 ENCOUNTER — Encounter: Payer: Self-pay | Admitting: Adult Health

## 2024-03-24 ENCOUNTER — Telehealth: Admitting: Adult Health

## 2024-03-24 DIAGNOSIS — F411 Generalized anxiety disorder: Secondary | ICD-10-CM

## 2024-03-24 NOTE — Progress Notes (Signed)
 Michelle Haynes [id number] 1960/12/16 63 y.o.  Virtual Visit via Video Note  I connected with pt @ on 03/24/24 at 10:30 AM EDT by a video enabled telemedicine application and verified that I am speaking with the correct person using two identifiers.   I discussed the limitations of evaluation and management by telemedicine and the availability of in person appointments. The patient expressed understanding and agreed to proceed.  I discussed the assessment and treatment plan with the patient. The patient was provided an opportunity to ask questions and all were answered. The patient agreed with the plan and demonstrated an understanding of the instructions.   The patient was advised to call back or seek an in-person evaluation if the symptoms worsen or if the condition fails to improve as anticipated.  I provided 25 minutes of non-face-to-face time during this encounter.  The patient was located at home.  The provider was located at [facility].   Angeline LOISE Sayers, NP   Subjective:   Patient ID:  Michelle Haynes is a 63 y.o. (DOB [birth year]) female.  Chief Complaint: No chief complaint on file.   HPI Michelle Haynes presents for follow-up of GAD.  Describes mood today as ok. Pleasant. Reports tearfulness. Mood symptoms - reports decreased depression, anxiety and irritability - not a lot. Reports varying interest and motivation. Denies recent panic attacks. Reports worry, rumination and over thinking. Reports obsessive thoughts. Denies obsessive acts. Reports mood has improved. Stating I feel like I have a safety net. Feels like the addition of Propranolol  has been helpful. Stating it calms the noise in my brain Reports taking current medications as prescribed.  Energy levels stable. Active, has a regular exercise routine.  Enjoys some usual interests and activities. Single. Lives alone. Has a 88 year old son. Spending time with family and friends. Appetite adequate. Weight loss  - 47 pounds - GLP-1. Reports sleep has improved - still waking up during the night, but able to get back to sleep. Averages 7 to 7.5 hours. Focus and concentration stable. Completing tasks. Managing aspects of household. Works full-time Sales executive. Denies SI or HI.  Denies AH or VH. Denies self harm. Denies substance use.  Previous medication trials: History of antidepressants  Review of Systems:  Review of Systems  Musculoskeletal:  Negative for gait problem.  Neurological:  Negative for tremors.  Psychiatric/Behavioral:         Please refer to HPI    Medications: I have reviewed the patient's current medications.  Current Outpatient Medications  Medication Sig Dispense Refill   ALPRAZolam  (XANAX ) 0.5 MG tablet Take 0.5-1 tablets (0.25-0.5 mg total) by mouth daily as needed for anxiety. 20 tablet 2   Ascorbic Acid (VITAMIN C PO) Take 1 tablet by mouth daily.     Cholecalciferol (VITAMIN D ) 125 MCG (5000 UT) CAPS Take 10,000-15,000 Units by mouth daily.     clobetasol  ointment (TEMOVATE ) 0.05 % Apply 1 application (a thin layer) topically to affected areas 2 (two) times daily. 60 g 0   COLLAGEN PO Take 6 tablets by mouth daily.     COVID-19 mRNA vaccine 2024-2025 (COMIRNATY ) syringe Inject 0.3 mLs into the muscle. 0.3 mL 0   cyclobenzaprine  (FLEXERIL ) 10 MG tablet Take 0.5-1 tablets (5-10 mg total) by mouth every 8 (eight) hours as needed for spasms. 30 tablet 0   diphenhydrAMINE  (BENADRYL ) 25 MG tablet Take 50 mg by mouth at bedtime.     estradiol  (ESTRACE  VAGINAL) 0.1 MG/GM vaginal cream Place a pea-sized amount  to the external vagina 2 (two) times daily for 14 days. Then nightly 2-3 times a week. 42.5 g 1   hydroquinone  4 % cream Apply as directed to affected area once daily on hands. 28.35 g 2   ibuprofen (ADVIL) 200 MG tablet Take 400 mg by mouth every 6 (six) hours as needed for moderate pain or headache.     loperamide  (IMODIUM  A-D) 2 MG tablet Take 2 mg by mouth 4 (four) times  daily as needed for diarrhea or loose stools.     meloxicam  (MOBIC ) 15 MG tablet Take 1 tablet by mouth once a day with food 30 tablet 1   propranolol  (INDERAL ) 10 MG tablet Take 1 tablet (10 mg total) by mouth 3 (three) times daily. 90 tablet 2   rosuvastatin  (CRESTOR ) 20 MG tablet Take 1 tablet (20 mg total) by mouth at bedtime for cholesterol 90 tablet 0   Semaglutide -Weight Management 0.25 MG/0.5ML SOAJ Inject 0.25 mg into the skin once a week. 2 mL 0   Semaglutide -Weight Management 0.25 MG/0.5ML SOAJ Inject into the skin once a week. 2 mL 0   Semaglutide -Weight Management 0.25 MG/0.5ML SOAJ Inject 0.25 mg into the skin once a week. 2 mL 0   triamcinolone  cream (KENALOG ) 0.1 % Apply 1 Application topically 2 (two) times daily. 30 g 0   No current facility-administered medications for this visit.    Medication Side Effects: None  Allergies:  Allergies  Allergen Reactions   Buspirone Nausea And Vomiting    from Surescripts   Naltrexone-Bupropion Hcl Er Diarrhea    from [facility]ures[facility]s   Squid Oil     squid (from surescripts)    Past Medical History:  Diagnosis Date   High cholesterol    Hypertension     Family History  Problem Relation Age of Onset   Stroke Mother    Depression Mother    Diabetes Father    Anxiety disorder Father     Social History   Socioeconomic History   Marital status: Single    Spouse name: Not on file   Number of children: 1   Years of education: Not on file   Highest education level: Not on file  Occupational History   Not on file  Tobacco Use   Smoking status: Never    Passive exposure: Never   Smokeless tobacco: Never  Vaping Use   Vaping status: Never Used  Substance and Sexual Activity   Alcohol use: Yes    Comment: occasional   Drug use: Never   Sexual activity: Not on file  Other Topics Concern   Not on file  Social History Narrative   Not on file   Social Drivers of Health   Financial Resource Strain: Not on file  Food  Insecurity: Not on file  Transportation Needs: Not on file  Physical Activity: Sufficiently Active (12/31/2023)   Received from [facility]   Exercise Vital Sign    On average, how many days per week do you engage in moderate to strenuous exercise (like a brisk walk)?: 5 days    On average, how many minutes do you engage in exercise at this level?: 30 min  Stress: Not on file  Social Connections: Not on file  Intimate Partner Violence: Not on file    Past Medical History, Surgical history, Social history, and Family history were reviewed and updated as appropriate.   Please see review of systems for further details on the patient's review from today.  Objective:   Physical Exam:  LMP 03/03/2018 Comment: U preg neg 03/15/16  Physical Exam Constitutional:      General: She is not in acute distress. Musculoskeletal:        General: No deformity.  Neurological:     Mental Status: She is alert and oriented to person, place, and time.     Coordination: Coordination normal.  Psychiatric:        Attention and Perception: Attention and perception normal. She does not perceive auditory or visual hallucinations.        Mood and Affect: Mood normal. Mood is not anxious or depressed. Affect is not labile, blunt, angry or inappropriate.        Speech: Speech normal.        Behavior: Behavior normal.        Thought Content: Thought content normal. Thought content is not paranoid or delusional. Thought content does not include homicidal or suicidal ideation. Thought content does not include homicidal or suicidal plan.        Cognition and Memory: Cognition and memory normal.        Judgment: Judgment normal.     Comments: Insight intact     Lab Review:     Component Value Date/Time   NA 139 01/23/2022 1530   NA 141 04/04/2021 0851   K 4.3 01/23/2022 1530   CL 104 01/23/2022 1530   CO2 24 01/23/2022 1530   GLUCOSE 97 01/23/2022 1530   BUN 15 01/23/2022 1530   BUN 15  04/04/2021 0851   CREATININE 0.81 01/23/2022 1530   CALCIUM  10.4 (H) 01/23/2022 1530   PROT 6.9 04/04/2021 0851   ALBUMIN 4.8 04/04/2021 0851   AST 23 04/04/2021 0851   ALT 35 (H) 04/04/2021 0851   ALKPHOS 102 04/04/2021 0851   BILITOT 0.5 04/04/2021 0851   GFRNONAA >60 01/23/2022 1530   GFRAA 99.6 01/13/2021 0000       Component Value Date/Time   WBC 9.9 01/23/2022 1530   RBC 4.66 01/23/2022 1530   HGB 14.0 01/23/2022 1530   HCT 43.0 01/23/2022 1530   PLT 431 (H) 01/23/2022 1530   MCV 92.3 01/23/2022 1530   MCH 30.0 01/23/2022 1530   MCHC 32.6 01/23/2022 1530   RDW 12.9 01/23/2022 1530    No results found for: POCLITH, LITHIUM   No results found for: PHENYTOIN, PHENOBARB, VALPROATE, CBMZ   .res Assessment: Plan:    Treatment Plan/Recommendations:   Plan:  PDMP reviewed  Propranolol  10mg  TID prn anxiety  RTC 3 months  25 minutes spent dedicated to the care of this patient on the date of this encounter to include pre-visit review of records, ordering of medication, post visit documentation, and face-to-face time with the patient discussing GAD.  Discussed continuing current medication regimen.  Patient advised to contact office with any questions, adverse effects, or acute worsening in signs and symptoms.  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  Future Appointments  Date Time Provider Department Center  04/08/2024  6:00 PM Marijean Charleston, PhD CP[facility] None  04/24/2024  2:00 PM Marijean Charleston, PhD CP[facility] None  04/28/2024  4:45 PM Norberto Thersia NOVAK, RD [facility] NDM  04/29/2024  5:00 PM [facility] MM 3 [facility]  05/05/2024  4:45 PM Scotece, Thersia NOVAK, RD [facility] NDM  05/06/2024  5:00 PM Marijean Charleston, PhD CP-CP None  05/19/2024  4:00 PM Marijean Charleston, PhD CP-CP None  06/03/2024  5:00 PM Marijean Charleston, PhD CP[facility] None   06/16/2024 11:00 AM Marijean Charleston, PhD CP-CP None    No orders of the defined types were placed in this encounter.     -------------------------------

## 2024-04-08 ENCOUNTER — Ambulatory Visit: Admitting: Psychiatry

## 2024-04-24 ENCOUNTER — Ambulatory Visit: Admitting: Psychiatry

## 2024-04-24 DIAGNOSIS — F5089 Other specified eating disorder: Secondary | ICD-10-CM | POA: Diagnosis not present

## 2024-04-24 DIAGNOSIS — F341 Dysthymic disorder: Secondary | ICD-10-CM | POA: Diagnosis not present

## 2024-04-24 DIAGNOSIS — F411 Generalized anxiety disorder: Secondary | ICD-10-CM

## 2024-04-24 NOTE — Progress Notes (Signed)
 Psychotherapy Progress Note [facility] Jodie Kendall, PhD LP  Patient ID: Michelle Haynes)    MRN: [id number] Therapy format: Individual psychotherapy Date: 04/24/2024      Start: 2:12p     Stop: 3:00p     Time Spent: 48 min Location: Telehealth visit -- I connected with this patient by an approved telecommunication method (video), with her informed consent, and verifying identity and patient privacy.  I was located at my office and patient at her home.  As needed, we discussed the limitations, risks, and security and privacy concerns associated with telehealth service, including the availability and conditions which currently govern in-person appointments and the possibility that 3rd-party payment may not be fully guaranteed and she may be responsible for charges.  After she indicated understanding, we proceeded with the session.  Also discussed treatment planning, as needed, including ongoing verbal agreement with the plan, the opportunity to ask and answer all questions, her demonstrated understanding of instructions, and her readiness to call the office should symptoms worsen or she feels she is in a crisis state and needs more immediate and tangible assistance.   Session narrative (presenting needs, interim history, self-report of stressors and symptoms, applications of prior therapy, status changes, and interventions made in session) Changed GLP-1s (tirzepatide now, instead of semaglutide ) and finding it makes her more irritable.  Not hangry, per se, but more easily irritated.  And it's not as effective at suppressing desires for alcohol, unfortunately.  Much preferable on cost, and now just 15 lbs from goal, with plan to carry out about 6-12 months.  Contracting with roommate to try 2 weeks on [facility] induction phase diet when she gets back, but is leery of facing walls in severe carb control.  Framed as soft evidence of disordered insulin response from years of carb  intake (both food and alcohol), and recommended again that MCT oil can serve as a craving breaker.  Re-educated on the emotional benefits of taming carbs and shifting metabolism, which [facility] should be well positioned to help do.  Agreed, and ordered MCT online in session.    Otherwise, she is trying to meter out alcohol -- 4 oz liquor limit, 3-4 days a wk.  Encouraged to hold that limit, of course, and to experiment with mindful drinking by varying her approach to tastes -- with mix, no mix, sour v. all sweet, pace, slow down and taste, not just deliver the intoxicant.  As for emotional reactions, brief story of a working meeting where she was tempted to mouth off but held herself.  Affirmed her self-control in progress, and benefit of having already made progress on metabolic stress through weight loss and both the sensory satisfaction and self-image assist that comes with it.  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  Also acknowledging again how she has painfully unfulfilled love and sexual needs, and automatic negative thoughts drawn from her dad's commentaries on her growing up.    Centering on the self-esteem component, coached in recognizing and disputing those thoughts, possibly in figuring that even her dad -- with benefit of godly enlightenment -- would not want to say them to her any more, actually.  Meanwhile, recommended an experiment to find a Warren Jama Brasil book, I'm Gonna Like Me, and with or  without buying, read it from the perspective of her younger self.  Did sign herself up for a small women's group at [facility].  Commended on the risk-taking and working to her own good having a support system and letting herself be better known.  Therapeutic  modalities: Cognitive Behavioral Therapy, Solution-Oriented/Positive Psychology, Environmental manager, and Faith-sensitive  Mental Status/Observations:  Appearance:   Casual     Behavior:  Appropriate  Motor:  Normal  Speech/Language:   Clear and Coherent  Affect:  Appropriate  Mood:  dysthymic  Thought process:  normal, some tendency to skip  Thought content:    WNL  Sensory/Perceptual disturbances:    WNL  Orientation:  Fully oriented  Attention:  Good    Concentration:  Fair  Memory:  WNL  Insight:    Good  Judgment:   Good  Impulse Control:  Variable   Risk Assessment: Danger to Self: No Self-injurious Behavior: No Danger to Others: No Physical Aggression / Violence: No Duty to Warn: No Access to Firearms a concern: No  Assessment of progress:  progressing  Diagnosis:   ICD-10-CM   1. 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 Persistent depressive disorder  F34.1     3. 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  Allow herself to join up with groups or ministries of choice.  Build out from healed friendship with [name] in other ways as available. Food and weight mgmt -- Endorse continued GLP-1, as tolerated and affordable.  Continue looking for ways to do small commitments in exercise -- 10-minute video, for one.  Continue practicing restraint and healthful variety in food, esp using Weight Watchers tools she is familiar with.  Cont limit alcohol. Social anxiety -- Practice trust in friends to be discreet, understanding, and available, and be ready to acknowledge complaints if any actually come up.  Manage anger to reduce likelihood of alienating.  As body image is a potent concern, practice accepting good enough for now in any way possible.  Endorse situational use  of beta blocker. Worry -- Practice trust in relatives to manage.  For intrusive thoughts, try asking if the problem that comes up is truly likely and truly unknown what to do if that concern comes up (i.e., answer the what if question).  Practice deciding when enough problem solving is enough also, and imagine how either her own actions, other forces, or God would address the problem in question. Mood management and self-esteem -- Journal ad lib, note any priorities to process and any thought patterns that may tend to exacerbate distress or dysphoria, especially shaming.  Use slogan The beating drives the eating; no beating required.  Monitor for compulsive drinking.  Monitor for any need to process intense memories or regrets.   Sleep -- Observe limits on melatonin and don't expect it to force sleep; dose 3-10 mg at discretion 1-2 hrs before intended bedtime and practice sleep hygiene measures.  Try out amber lenses as a more natural way.  Pay attention to conscious consent to sleep and truly settling regrets and worries. Spiritual concerns -- Let it be an open question whether God has ways she does not know for reaching those she gets concerned about.  Prioritize showing grace over preaching or imaginable coercion with loved ones, recognizing how he reacts, and  what peace she brings to it, as nonverbal evangelism, actually. Family concerns -- Continue to show interest, voice support, refrain from surprise criticism with son Chauncey.  If confrontation needed, get an acceptance first to have a difficult conversation.  Continue to recruit trust with sister through nonreaction, warmth as she approaches, and willingness to help her break down assumptions (e.g., what she thought was silent treatment is taking pressure off and letting her decide, and what she thought may be irritation is actually missing her). Other recommendations/advice -- As may be noted above.  Continue to utilize previously learned skills ad  lib. Medication compliance -- Maintain medication as prescribed and work faithfully with relevant prescriber(s) if any changes are desired or seem indicated. Crisis service -- Aware of call list and work-in appts.  Call the clinic on-call service, 988/hotline, 911, or present to [facility] or ER if any life-threatening psychiatric crisis. Followup -- Return for time as already scheduled.  Next scheduled visit with me 05/06/2024.  Next scheduled in this office 05/06/2024.  Lamar Kendall, PhD Jodie Kendall, PhD LP Clinical Psychologist, [facility] [address], [address] [place], [place] [address] [phone]

## 2024-04-28 ENCOUNTER — Encounter: Payer: Self-pay | Admitting: Skilled Nursing Facility1

## 2024-04-28 ENCOUNTER — Encounter: Attending: Obstetrics and Gynecology | Admitting: Skilled Nursing Facility1

## 2024-04-28 DIAGNOSIS — E631 Imbalance of constituents of food intake: Secondary | ICD-10-CM | POA: Insufficient documentation

## 2024-04-28 NOTE — Progress Notes (Unsigned)
 Medical Nutrition Therapy  Appointment Start time:  4:41  Appointment End time:  5:40  NUTRITION ASSESSMENT    Clinical Medical Hx: hyperlipdemia, HTN Medications: tirzepitide Labs: Pt stated: LDL 157 Notable Signs/Symptoms:   Lifestyle & Dietary Hx  Pt states she has been at her current weight since February. Pt states she is aiming to lose 15-20 pounds.  Pt states she does have a food/snack/vitamin drawer at work.  Estimated daily fluid intake:  oz Supplements: vitamin D  and K2, multivitamin, collagen   Sleep:  Stress / self-care:  Current average weekly physical activity:   24-Hr Dietary Recall First Meal: Fairlife shake and chocolate  Snack:  Second Meal: fruit + deli meat sandwich + chips Snack: chocolate  Third Meal: core like taco  Snack:  Beverages: black coffee, water, occasionally soda, 1-2 wine/margerita 3 nights a week   NUTRITION INTERVENTION  Nutrition education (E-1) on the following topics:  Creation of balanced and diverse meals to increase the intake of nutrient-rich foods that provide essential vitamins, minerals, fiber, and phytonutrients  Variety of Fruits and Vegetables:  Aim for a colorful array of fruits and vegetables to ensure a wide range of nutrients. Include a mix of leafy greens, berries, citrus fruits, cruciferous vegetables, and more. Whole Grains: Choose whole grains over refined grains. Examples include brown rice, quinoa, oats, whole wheat, and barley. Lean Proteins: Include lean sources of protein, such as poultry, fish, tofu, legumes, beans, lentils, and low-fat dairy products. Limit red and processed meats. Healthy Fats: Incorporate sources of healthy fats, including avocados, nuts, seeds, and olive oil. Limit saturated and trans fats found in fried and processed foods. Dairy or Dairy Alternatives: Choose low-fat or fat-free dairy products, or plant-based alternatives like almond or soy milk. Portion Control: Be mindful of  portion sizes to avoid overeating. Pay attention to hunger and satisfaction cues. Limit Added Sugars: Minimize the consumption of sugary beverages, snacks, and desserts. Check food labels for added sugars and opt for natural sources of sweetness such as whole fruits. Hydration: Drink plenty of water throughout the day. Limit sugary drinks and excessive caffeine intake. Moderate Sodium Intake: Reduce the consumption of high-sodium foods. Use herbs and spices for flavor instead of excessive salt. Meal Planning and Preparation: Plan and prepare meals ahead of time to make healthier choices more convenient. Include a mix of food groups in each meal. Limit Processed Foods: Minimize the intake of highly processed and packaged foods that are often high in added sugars, salt, and unhealthy fats. Regular Physical Activity: Combine a healthy diet with regular physical activity for overall well-being. Aim for at least 150 minutes of moderate-intensity aerobic exercise per week, along with strength training. Moderation and Balance: Enjoy treats and indulgent foods in moderation, emphasizing balance rather than strict restriction.  Handouts Provided Include  Detailed MyPlate  Learning Style & Readiness for Change Teaching method utilized: Visual & Auditory  Demonstrated degree of understanding via: Teach Back  Barriers to learning/adherence to lifestyle change: unidentified    MONITORING & EVALUATION Dietary intake, weekly physical activity  Next Steps  Patient is to call or email with any future questions or concerns .

## 2024-04-29 ENCOUNTER — Ambulatory Visit

## 2024-05-05 ENCOUNTER — Ambulatory Visit: Admitting: Skilled Nursing Facility1

## 2024-05-06 ENCOUNTER — Ambulatory Visit: Admitting: Psychiatry

## 2024-05-07 DIAGNOSIS — E782 Mixed hyperlipidemia: Secondary | ICD-10-CM | POA: Diagnosis not present

## 2024-05-07 DIAGNOSIS — M899 Disorder of bone, unspecified: Secondary | ICD-10-CM | POA: Diagnosis not present

## 2024-05-07 DIAGNOSIS — E669 Obesity, unspecified: Secondary | ICD-10-CM | POA: Diagnosis not present

## 2024-05-07 DIAGNOSIS — F411 Generalized anxiety disorder: Secondary | ICD-10-CM | POA: Diagnosis not present

## 2024-05-12 ENCOUNTER — Other Ambulatory Visit: Payer: Self-pay | Admitting: Obstetrics and Gynecology

## 2024-05-12 ENCOUNTER — Ambulatory Visit
Admission: RE | Admit: 2024-05-12 | Discharge: 2024-05-12 | Disposition: A | Discharge status: Home / Self Care | Source: Ambulatory Visit | Attending: Obstetrics and Gynecology | Admitting: Obstetrics and Gynecology

## 2024-05-12 DIAGNOSIS — Z1231 Encounter for screening mammogram for malignant neoplasm of breast: Secondary | ICD-10-CM

## 2024-05-19 ENCOUNTER — Ambulatory Visit: Admitting: Psychiatry

## 2024-05-19 DIAGNOSIS — F411 Generalized anxiety disorder: Secondary | ICD-10-CM

## 2024-05-19 DIAGNOSIS — F5089 Other specified eating disorder: Secondary | ICD-10-CM | POA: Diagnosis not present

## 2024-05-19 DIAGNOSIS — F341 Dysthymic disorder: Secondary | ICD-10-CM | POA: Diagnosis not present

## 2024-05-19 NOTE — Progress Notes (Signed)
 Psychotherapy Progress Note [facility] Jodie Kendall, PhD LP  Patient ID: Michelle Haynes)    MRN: [id number] Therapy format: Individual psychotherapy Date: 05/19/2024      Start: 4:24p     Stop: 5:14p     Time Spent: 50 min Location: Telehealth visit -- I connected with this patient by an approved telecommunication method (video), with her informed consent, and verifying identity and patient privacy.  I was located at my office and patient at her home.  As needed, we discussed the limitations, risks, and security and privacy concerns associated with telehealth service, including the availability and conditions which currently govern in-person appointments and the possibility that 3rd-party payment may not be fully guaranteed and she may be responsible for charges.  After she indicated understanding, we proceeded with the session.  Also discussed treatment planning, as needed, including ongoing verbal agreement with the plan, the opportunity to ask and answer all questions, her demonstrated understanding of instructions, and her readiness to call the office should symptoms worsen or she feels she is in a crisis state and needs more immediate and tangible assistance.   Session narrative (presenting needs, interim history, self-report of stressors and symptoms, applications of prior therapy, status changes, and interventions made in session) Continues to see increased pressure to drink -- holding at 2 drinks, but 3-4 nights a week.  Bothers her because it's working against her goals to reduce calories and slim down.  As discussed last time, her preference for very sweet mixes is at issue, so she is starting to look at low carb mix but it's expensive.  Discussed ways to break up stimulus control and compulsivity, including experimenting with different tastes, making her own low carb mix, changing beverages, and doing anything else before resorting to the first drink, especially if it  is something soothing.    Recognizes a strong tendency to start resisting as soon as she tries to commit to something, related hearing an episode of Hidden Brain on [employer] that made sense to her.  Encouraged to recognize and dial back making herself promises, refocus instead on the momentary opportunity to see herself do something she values as good, and try to approach discipline behaviors with curiosity whether she'll like something about them.  Hopefully that confers more immediate reward and mitigates the delayed gratification effect.  Dietician visit validated further that she tends to be all or none thinking.  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  Same problem of resistance to her own self-direction.  Brainstormed ways of getting herself to study effectively -- condense notes she already took, listen (or re-listen) instead of trying too hard to write, start with practice questions instead of starting with answers, random access looking up info, check the glossary and see what pops as curious to learn, use pomodoro method (e.g., Windows timer).  Agrees any of those would help break down resistance and make the experience more satisfying.    As for morale, recognizes some comedown from visiting [facility] for LDWE.  IBS-D makes travel stressful, of course.    Tenant/roommate is willing to do [facility] with her.  Had planned to embark on it after the trip, but now set to start together next week.  Encouraged that carb control will have multiple benefits, possibly including IBS-D.  Has used MCT,  carefully, and finding it helpful to tame cravings, figuring out dosages.  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  Encouraged further in reforms to diet and habits.  Therapeutic modalities: Cognitive Behavioral Therapy and  Solution-Oriented/Positive Psychology  Mental Status/Observations:  Appearance:   Casual     Behavior:  Appropriate  Motor:  Normal  Speech/Language:   Clear and Coherent  Affect:  Appropriate  Mood:  Mildly dysthymic  Thought process:  normal  Thought content:    WNL  Sensory/Perceptual disturbances:    WNL  Orientation:  Fully oriented  Attention:  Good    Concentration:  Good  Memory:  WNL  Insight:    Good  Judgment:   Good  Impulse Control:  Good   Risk Assessment: Danger to Self: No Self-injurious Behavior: No Danger to Others: No Physical Aggression / Violence: No Duty to Warn: No Access to Firearms a concern: No  Assessment of progress:  progressing  Diagnosis:   ICD-10-CM   1. 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 Persistent depressive disorder  F34.1     3. Compulsive overeating  F50.89    GLP controlled     Plan:  Social involvement/roots -- Maintain church involvement, despite possibility of pulling up and moving.  Allow herself to join up with groups or ministries of choice.  Build out from healed friendship with [name] in other ways as available. Food and weight mgmt -- Endorse continued GLP-1, as tolerated and affordable.  Continue looking for ways to do small commitments in exercise -- 10-minute video, for one.  Continue practicing restraint and healthful variety in food, esp using Weight Watchers tools she is familiar with.  Cont limit alcohol. Social anxiety -- Practice trust in friends to be discreet, understanding, and available, and be ready to acknowledge complaints if any actually come up.  Manage anger to reduce likelihood of alienating.  As body image is a potent concern, practice accepting good enough for now in any way possible.  Endorse situational use of beta blocker. Worry -- Practice trust in relatives to manage.  For intrusive thoughts, try asking if the problem that comes up is truly likely and truly unknown  what to do if that concern comes up (i.e., answer the what if question).  Practice deciding when enough problem solving is enough also, and imagine how either her own actions, other forces, or God would address the problem in question. Mood management and self-esteem -- Journal ad lib, note any priorities to process and any thought patterns that may tend to exacerbate distress or dysphoria, especially shaming.  Use slogan The beating drives the eating; no beating required.  Monitor for compulsive drinking.  Monitor for any need to process intense memories or regrets.   Sleep -- Observe limits on melatonin and don't expect it to force sleep; dose 3-10 mg at discretion 1-2 hrs before intended bedtime and practice sleep hygiene measures.  Try out amber lenses as a more natural way.  Pay attention to conscious consent to sleep and truly settling regrets and worries. Spiritual concerns -- Let it be an open question whether God has ways she does not know for reaching those she gets concerned about.  Prioritize showing grace over preaching or imaginable coercion with loved ones, recognizing how he reacts, and what peace she brings to it, as nonverbal evangelism, actually. Family concerns -- Continue to show interest, voice support, refrain from surprise criticism with son Chauncey.  If confrontation needed, get an acceptance first to have a difficult conversation.  Continue to recruit trust with sister through nonreaction, warmth as she approaches, and willingness to help her break down assumptions (e.g., what she thought was silent treatment is taking pressure off and letting her decide, and what she thought may be irritation is actually missing her). Other recommendations/advice -- As may be noted above.  Continue to utilize previously learned skills ad lib. Medication compliance -- Maintain medication as prescribed and work faithfully with relevant prescriber(s) if any changes are desired or seem indicated. Crisis  service -- Aware of call list and work-in appts.  Call the clinic on-call service, 988/hotline, 911, or present to [facility] or ER if any life-threatening psychiatric crisis. Followup -- Return for time as already scheduled.  Next scheduled visit with me 06/03/2024.  Next scheduled in this office 06/03/2024.  Lamar Kendall, PhD Jodie Kendall, PhD LP Clinical Psychologist, [facility] [address], [address] [place], [place] [address] [phone]

## 2024-05-21 ENCOUNTER — Ambulatory Visit: Admitting: Skilled Nursing Facility1

## 2024-05-21 ENCOUNTER — Other Ambulatory Visit: Payer: Self-pay

## 2024-05-21 MED ORDER — COVID-19 MRNA VAC-TRIS(PFIZER) 30 MCG/0.3ML IM SUSY
0.3000 mL | PREFILLED_SYRINGE | Freq: Once | INTRAMUSCULAR | 0 refills | Status: AC
  Filled 2024-05-21: qty 0.3, 1d supply, fill #0

## 2024-06-03 ENCOUNTER — Ambulatory Visit: Admitting: Psychiatry

## 2024-06-03 DIAGNOSIS — F341 Dysthymic disorder: Secondary | ICD-10-CM

## 2024-06-03 DIAGNOSIS — F5089 Other specified eating disorder: Secondary | ICD-10-CM

## 2024-06-03 DIAGNOSIS — F411 Generalized anxiety disorder: Secondary | ICD-10-CM

## 2024-06-03 DIAGNOSIS — R69 Illness, unspecified: Secondary | ICD-10-CM | POA: Diagnosis not present

## 2024-06-03 NOTE — Progress Notes (Signed)
 Psychotherapy Progress Note [facility] 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  Inconvenient hot flash curing the video call.  Compatible management style, and she felt she did better at being available, forthcoming, and comfortable conversing.  Good opportunity to highlight her strengths and learnings from disparate careers.  F/u with a well-crafted thank you and followup message.  Agreed sounds like a good fit, just need to figure out if it's an offer, and pay needs can be reconciled.  Affirmed and encouraged, and probed responses to hypothetical followups.  Has joined a small group Bible study in progress, hampered a bit by a virus threat, and a member's substantial need for support, but remains open to seeing what materializes on a normal basis.  Affirmed and encouraged.  Chauncey is still in training, not necessarily happy with his position but sticking with it.  His wife Isaiah still seeking a job.  Settled into a supportive posture with them at this point, no lectures, no uninvited advice.  Affirmed and encouraged.  Therapeutic modalities: Cognitive Behavioral Therapy, Solution-Oriented/Positive Psychology, and Ego-Supportive  Mental Status/Observations:  Appearance:   Casual     Behavior:  Appropriate  Motor:  Normal  Speech/Language:   Clear and Coherent  Affect:  Appropriate  Mood:  normal  Thought process:  normal  Thought content:    WNL  Sensory/Perceptual disturbances:    WNL   Orientation:  Fully oriented  Attention:  Good    Concentration:  Good  Memory:  WNL  Insight:    Good  Judgment:   Good  Impulse Control:  Good   Risk Assessment: Danger to Self: No Self-injurious Behavior: No Danger to Others: No Physical Aggression / Violence: No Duty to Warn: No Access to Firearms a concern: No  Assessment of progress:  progressing  Diagnosis:   ICD-10-CM   1. 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 Persistent depressive disorder  F34.1     3. Compulsive overeating  F50.89     4. r/o PTSD (childhood abuse)  R69      Plan:  Social involvement/roots -- Maintain church involvement, despite possibility of pulling up and moving.  Allow herself to join up with groups or ministries of choice.  Build out from healed friendship with [name] in other ways as available. Food and weight mgmt -- Endorse continued GLP-1, as tolerated and affordable.  Continue looking for ways to do small commitments in exercise -- 10-minute video, for one.  Continue practicing restraint and healthful variety in food, esp using Weight Watchers tools she is familiar with.  Cont limit alcohol. Social anxiety -- Practice trust in friends to be discreet, understanding, and available, and be ready to acknowledge complaints if any actually come up.  Manage anger to reduce likelihood of alienating.  As body image is a potent concern, practice accepting good enough for now in any way possible.  Endorse situational use of beta blocker. Worry -- Practice trust in relatives  to manage.  For intrusive thoughts, try asking if the problem that comes up is truly likely and truly unknown what to do if that concern comes up (i.e., answer the what if question).  Practice deciding when enough problem solving is enough also, and imagine how either her own actions, other forces, or God would address the problem in question. Mood management and self-esteem -- Journal ad lib, note any  priorities to process and any thought patterns that may tend to exacerbate distress or dysphoria, especially shaming.  Use slogan The beating drives the eating; no beating required.  Monitor for compulsive drinking.  Monitor for any need to process intense memories or regrets.   Sleep -- Observe limits on melatonin and don't expect it to force sleep; dose 3-10 mg at discretion 1-2 hrs before intended bedtime and practice sleep hygiene measures.  Try out amber lenses as a more natural way.  Pay attention to conscious consent to sleep and truly settling regrets and worries. Spiritual concerns -- Let it be an open question whether God has ways she does not know for reaching those she gets concerned about.  Prioritize showing grace over preaching or imaginable coercion with loved ones, recognizing how he reacts, and what peace she brings to it, as nonverbal evangelism, actually. Family concerns -- Continue to show interest, voice support, refrain from surprise criticism with son Chauncey.  If confrontation needed, get an acceptance first to have a difficult conversation.  Continue to recruit trust with sister through nonreaction, warmth as she approaches, and willingness to help her break down assumptions (e.g., what she thought was silent treatment is taking pressure off and letting her decide, and what she thought may be irritation is actually missing her). Other recommendations/advice -- As may be noted above.  Continue to utilize previously learned skills ad lib. Medication compliance -- Maintain medication as prescribed and work faithfully with relevant prescriber(s) if any changes are desired or seem indicated. Crisis service -- Aware of call list and work-in appts.  Call the clinic on-call service, 988/hotline, 911, or present to [facility] or ER if any life-threatening psychiatric crisis. Followup -- No follow-ups on file.  Next scheduled visit with me 06/16/2024.  Next scheduled in this office  06/16/2024.  Lamar Kendall, PhD Jodie Kendall, PhD LP Clinical Psychologist, [facility] [address] [place], [place] [address] [phone]

## 2024-06-16 ENCOUNTER — Ambulatory Visit: Admitting: Psychiatry

## 2024-06-16 DIAGNOSIS — F5089 Other specified eating disorder: Secondary | ICD-10-CM | POA: Diagnosis not present

## 2024-06-16 DIAGNOSIS — F341 Dysthymic disorder: Secondary | ICD-10-CM

## 2024-06-16 DIAGNOSIS — F411 Generalized anxiety disorder: Secondary | ICD-10-CM

## 2024-06-16 NOTE — Progress Notes (Signed)
 Psychotherapy Progress Note [facility] Jodie Kendall, PhD LP  Patient ID: Michelle Haynes)    MRN: [id number] Therapy format: Individual psychotherapy Date: 06/16/2024      Start: 11:20a     Stop: 12:00n     Time Spent: 40 min Location: Telehealth visit -- I connected with this patient by an approved telecommunication method (video), with her informed consent, and verifying identity and patient privacy.  I was located at my office and patient at her home.  As needed, we discussed the limitations, risks, and security and privacy concerns associated with telehealth service, including the availability and conditions which currently govern in-person appointments and the possibility that 3rd-party payment may not be fully guaranteed and she may be responsible for charges.  After she indicated understanding, we proceeded with the session.  Also discussed treatment planning, as needed, including ongoing verbal agreement with the plan, the opportunity to ask and answer all questions, her demonstrated understanding of instructions, and her readiness to call the office should symptoms worsen or she feels she is in a crisis state and needs more immediate and tangible assistance.   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  Losing some hope with the interview she had, hearing nothing and figuring others may have outshone her, or her potential employer figuring his needs lay elsewhere.  Had an awkward moment in her boss's boss's office.  Just in for a supportive chat about her prospects, ay boss's invitation, and could not remember her name.  Normalized as stress and sleep loss phenomenon and lightened the mood with comparison to an Psychologist, sport and exercise.  Meanwhile, she has joined a Education officer, museum, nearest one in [place].  Getting good suggestions.  Using AI productively to help her generate writing for her application, as well as human advice.  Sent application yesterday, which starts the clock to be assigned to take the exam.  Been struggling with her sense of impostorhood, had thought about a fallback position to sit for the assistant's exam (CAPM), but determined now to sit for the full certification.  Getting a study accountability partner, and committing to full-time study soon.  Affirmed and encouraged.  Therapeutic modalities: Cognitive Behavioral Therapy, Solution-Oriented/Positive Psychology, and Ego-Supportive  Mental Status/Observations:  Appearance:   Casual     Behavior:  Appropriate  Motor:  Normal  Speech/Language:   Clear and Coherent  Affect:  Appropriate  Mood:  anxious  Thought process:  normal  Thought content:    WNL  Sensory/Perceptual disturbances:    WNL  Orientation:  Fully oriented  Attention:  Good    Concentration:  Good  Memory:  WNL  Insight:    Good  Judgment:   Good  Impulse Control:  Good   Risk Assessment: Danger to Self: No Self-injurious Behavior: No Danger to Others: No Physical Aggression / Violence: No Duty to Warn: No Access to Firearms a concern: No  Assessment of progress:  progressing  Diagnosis:   ICD-10-CM   1. 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 Persistent depressive disorder  F34.1     3. Compulsive overeating  F50.89      Plan:  Social involvement/roots -- Maintain church involvement, despite possibility of pulling up  and moving.  Allow herself to join up with groups or ministries of choice.  Build out from healed friendship with [name] in other ways as available. Food and weight mgmt -- Endorse continued GLP-1, as tolerated and affordable.  Continue looking for ways to do small commitments in  exercise -- 10-minute video, for one.  Continue practicing restraint and healthful variety in food, esp using Weight Watchers tools she is familiar with.  Cont limit alcohol. 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 Social anxiety -- Practice trust in friends to be discreet, understanding, and available, and be ready to acknowledge complaints if any actually come up.  Manage anger to reduce likelihood of alienating.  As body image is a potent concern, practice accepting good enough for now in any way possible.  Endorse situational use of beta blocker. Worry -- Practice trust in relatives to manage.  For intrusive thoughts, try asking if the problem that comes up is truly likely and truly unknown what to do if that concern comes up (i.e., answer the what if question).  Practice deciding when enough problem solving is enough also, and imagine how either her own actions, other forces, or God would address the problem in question. Mood management and self-esteem -- Journal ad lib, note any priorities to process and any thought patterns that may tend to exacerbate distress or dysphoria, especially shaming.  Use slogan The beating drives the eating; no beating required.  Monitor for compulsive drinking.  Monitor for any need to process intense memories or regrets.   Sleep -- Observe limits on melatonin and don't expect it to force sleep; dose 3-10 mg at discretion 1-2 hrs before intended bedtime and practice sleep hygiene measures.  Try out amber lenses as a more natural way.  Pay attention to conscious consent to sleep and truly settling regrets and worries. Spiritual concerns -- Let it be an open question whether God has ways she does not know for reaching those she gets concerned about.  Prioritize showing grace over preaching or imaginable coercion with loved ones, recognizing how he reacts, and what peace she brings to it, as nonverbal evangelism,  actually. Family concerns -- Continue to show interest, voice support, refrain from surprise criticism with son Chauncey.  If confrontation needed, get an acceptance first to have a difficult conversation.  Continue to recruit trust with sister through nonreaction, warmth as she approaches, and willingness to help her break down assumptions (e.g., what she thought was silent treatment is taking pressure off and letting her decide, and what she thought may be irritation is actually missing her). Other recommendations/advice -- As may be noted above.  Continue to utilize previously learned skills ad lib. Medication compliance -- Maintain medication as prescribed and work faithfully with relevant prescriber(s) if any changes are desired or seem indicated. Crisis service -- Aware of call list and work-in appts.  Call the clinic on-call service, 988/hotline, 911, or present to [facility] or ER if any life-threatening psychiatric crisis. Followup -- Return for time as already scheduled.  Next scheduled visit with me 06/30/2024.  Next scheduled in this office 06/23/2024.  Lamar Kendall, PhD Jodie Kendall, PhD LP Clinical Psychologist, [facility] [address], [address] [place], [place] [address] [phone]

## 2024-06-23 ENCOUNTER — Telehealth: Admitting: Adult Health

## 2024-06-23 ENCOUNTER — Encounter: Payer: Self-pay | Admitting: Adult Health

## 2024-06-23 ENCOUNTER — Other Ambulatory Visit (HOSPITAL_COMMUNITY): Payer: Self-pay

## 2024-06-23 DIAGNOSIS — F411 Generalized anxiety disorder: Secondary | ICD-10-CM

## 2024-06-23 MED ORDER — PROPRANOLOL HCL 10 MG PO TABS
10.0000 mg | ORAL_TABLET | Freq: Three times a day (TID) | ORAL | 1 refills | Status: DC
  Filled 2024-06-23: qty 270, 90d supply, fill #0

## 2024-06-23 NOTE — Progress Notes (Signed)
 Michelle Haynes [id number] [birth year] 63 y.o.  Virtual Visit via Video Note  I connected with pt @ on 06/23/24 at 10:00 AM EDT by a video enabled telemedicine application and verified that I am speaking with the correct person using two identifiers.   I discussed the limitations of evaluation and management by telemedicine and the availability of in person appointments. The patient expressed understanding and agreed to proceed.  I discussed the assessment and treatment plan with the patient. The patient was provided an opportunity to ask questions and all were answered. The patient agreed with the plan and demonstrated an understanding of the instructions.   The patient was advised to call back or seek an in-person evaluation if the symptoms worsen or if the condition fails to improve as anticipated.  I provided 25 minutes of non-face-to-face time during this encounter.  The patient was located at home.  The provider was located at [facility].   Angeline LOISE Sayers, NP   Subjective:   Patient ID:  Michelle Haynes is a 63 y.o. (DOB 06-08-1961) female.  Chief Complaint: No chief complaint on file.   HPI Kashish Yglesias presents for follow-up of GAD.  Describes mood today as ok. Pleasant. Reports tearfulness. Mood symptoms - reports some irritability - a little bit. Reports decreased depression and anxiety. Reports varying interest and motivation. Denies recent panic attacks. Reports decreased worry, rumination and over thinking. Denies obsessive thoughts. Denies obsessive acts. Reports mood is stable. Stating I feel like I'm doing ok. Feels like the addition of Propranolol  has been helpful. Reports taking current medications as prescribed.  Energy levels stable. Active, has a regular exercise routine - 5 days a week.  Enjoys some usual interests and activities. Single. Lives alone. Has a 67 year old son. Spending time with family and friends. Appetite adequate. Reports weight loss -  GLP-1. Reports sleep has improved. Averages 7 to 7.5 hours. Focus and concentration stable. Completing tasks. Managing aspects of household. Works full-time Sales executive. Denies SI or HI.  Denies AH or VH. Denies self harm. Denies substance use.  Previous medication trials: History of antidepressants  Review of Systems:  Review of Systems  Musculoskeletal:  Negative for gait problem.  Neurological:  Negative for tremors.  Psychiatric/Behavioral:         Please refer to HPI    Medications: I have reviewed the patient's current medications.  Current Outpatient Medications  Medication Sig Dispense Refill   ALPRAZolam  (XANAX ) 0.5 MG tablet Take 0.5-1 tablets (0.25-0.5 mg total) by mouth daily as needed for anxiety. 20 tablet 2   Ascorbic Acid (VITAMIN C PO) Take 1 tablet by mouth daily.     Cholecalciferol (VITAMIN D ) 125 MCG (5000 UT) CAPS Take 10,000-15,000 Units by mouth daily.     clobetasol  ointment (TEMOVATE ) 0.05 % Apply 1 application (a thin layer) topically to affected areas 2 (two) times daily. 60 g 0   COLLAGEN PO Take 6 tablets by mouth daily.     COVID-19 mRNA vaccine 2024-2025 (COMIRNATY ) syringe Inject 0.3 mLs into the muscle. 0.3 mL 0   cyclobenzaprine  (FLEXERIL ) 10 MG tablet Take 0.5-1 tablets (5-10 mg total) by mouth every 8 (eight) hours as needed for spasms. 30 tablet 0   diphenhydrAMINE  (BENADRYL ) 25 MG tablet Take 50 mg by mouth at bedtime.     estradiol  (ESTRACE  VAGINAL) 0.1 MG/GM vaginal cream Place a pea-sized amount to the external vagina 2 (two) times daily for 14 days. Then nightly 2-3 times a week. 42.5  g 1   hydroquinone  4 % cream Apply as directed to affected area once daily on hands. 28.35 g 2   ibuprofen (ADVIL) 200 MG tablet Take 400 mg by mouth every 6 (six) hours as needed for moderate pain or headache.     loperamide  (IMODIUM  A-D) 2 MG tablet Take 2 mg by mouth 4 (four) times daily as needed for diarrhea or loose stools.     meloxicam  (MOBIC ) 15 MG tablet  Take 1 tablet by mouth once a day with food 30 tablet 1   propranolol  (INDERAL ) 10 MG tablet Take 1 tablet (10 mg total) by mouth 3 (three) times daily. 270 tablet 1   rosuvastatin  (CRESTOR ) 20 MG tablet Take 1 tablet (20 mg total) by mouth at bedtime for cholesterol 90 tablet 0   Semaglutide -Weight Management 0.25 MG/0.5ML SOAJ Inject 0.25 mg into the skin once a week. 2 mL 0   Semaglutide -Weight Management 0.25 MG/0.5ML SOAJ Inject into the skin once a week. 2 mL 0   Semaglutide -Weight Management 0.25 MG/0.5ML SOAJ Inject 0.25 mg into the skin once a week. 2 mL 0   triamcinolone  cream (KENALOG ) 0.1 % Apply 1 Application topically 2 (two) times daily. 30 g 0   No current facility-administered medications for this visit.    Medication Side Effects: None  Allergies:  Allergies  Allergen Reactions   Buspirone Nausea And Vomiting    from [facility]ures[facility]s   Naltrexone-Bupropion Hcl Er Diarrhea    from [facility]ures[facility]   Squid Oil     squid (from surescripts)    Past Medical History:  Diagnosis Date   High cholesterol    Hypertension     Family History  Problem Relation Age of Onset   Stroke Mother    Depression Mother    Diabetes Father    Anxiety disorder Father    BRCA 1/2 Neg Hx    Breast cancer Neg Hx     Social History   Socioeconomic History   Marital status: Single    Spouse name: Not on file   Number of children: 1   Years of education: Not on file   Highest education level: Not on file  Occupational History   Not on file  Tobacco Use   Smoking status: Never    Passive exposure: Never   Smokeless tobacco: Never  Vaping Use   Vaping status: Never Used  Substance and Sexual Activity   Alcohol use: Yes    Comment: occasional   Drug use: Never   Sexual activity: Not on file  Other Topics Concern   Not on file  Social History Narrative   Not on file   Social Drivers of Health   Financial Resource Strain: Not on file  Food Insecurity: Not on file   Transportation Needs: Not on file  Physical Activity: Sufficiently Active (12/31/2023)   Received from [facility]   Exercise Vital Sign    On average, how many days per week do you engage in moderate to strenuous exercise (like a brisk walk)?: 5 days    On average, how many minutes do you engage in exercise at this level?: 30 min  Stress: Not on file  Social Connections: Not on file  Intimate Partner Violence: Not on file    Past Medical History, Surgical history, Social history, and Family history were reviewed and updated as appropriate.   Please see review of systems for further details on the patient's review from today.   Objective:  Physical Exam:  LMP 03/03/2018 Comment: U preg neg 03/15/16  Physical Exam Constitutional:      General: She is not in acute distress. Musculoskeletal:        General: No deformity.  Neurological:     Mental Status: She is alert and oriented to person, place, and time.     Coordination: Coordination normal.  Psychiatric:        Attention and Perception: Attention and perception normal. She does not perceive auditory or visual hallucinations.        Mood and Affect: Mood normal. Mood is not anxious or depressed. Affect is not labile, blunt, angry or inappropriate.        Speech: Speech normal.        Behavior: Behavior normal.        Thought Content: Thought content normal. Thought content is not paranoid or delusional. Thought content does not include homicidal or suicidal ideation. Thought content does not include homicidal or suicidal plan.        Cognition and Memory: Cognition and memory normal.        Judgment: Judgment normal.     Comments: Insight intact     Lab Review:     Component Value Date/Time   NA 139 01/23/2022 1530   NA 141 04/04/2021 0851   K 4.3 01/23/2022 1530   CL 104 01/23/2022 1530   CO2 24 01/23/2022 1530   GLUCOSE 97 01/23/2022 1530   BUN 15 01/23/2022 1530   BUN 15 04/04/2021 0851    CREATININE 0.81 01/23/2022 1530   CALCIUM  10.4 (H) 01/23/2022 1530   PROT 6.9 04/04/2021 0851   ALBUMIN 4.8 04/04/2021 0851   AST 23 04/04/2021 0851   ALT 35 (H) 04/04/2021 0851   ALKPHOS 102 04/04/2021 0851   BILITOT 0.5 04/04/2021 0851   GFRNONAA >60 01/23/2022 1530   GFRAA 99.6 01/13/2021 0000       Component Value Date/Time   WBC 9.9 01/23/2022 1530   RBC 4.66 01/23/2022 1530   HGB 14.0 01/23/2022 1530   HCT 43.0 01/23/2022 1530   PLT 431 (H) 01/23/2022 1530   MCV 92.3 01/23/2022 1530   MCH 30.0 01/23/2022 1530   MCHC 32.6 01/23/2022 1530   RDW 12.9 01/23/2022 1530    No results found for: POCLITH, LITHIUM   No results found for: PHENYTOIN, PHENOBARB, VALPROATE, CBMZ   .res Assessment: Plan:    Treatment Plan/Recommendations:   Plan:  PDMP reviewed  Propranolol  10mg  TID prn anxiety  Using Xanax  occasionally for flight anxiety - will call when refill is needed.  RTC 3 months  25 minutes spent dedicated to the care of this patient on the date of this encounter to include pre-visit review of records, ordering of medication, post visit documentation, and face-to-face time with the patient discussing GAD. Discussed continuing current medication regimen.  Patient advised to contact office with any questions, adverse effects, or acute worsening in signs and symptoms.  Diagnoses and all orders for this visit:  Generalized anxiety disorder -     propranolol  (INDERAL ) 10 MG tablet; Take 1 tablet (10 mg total) by mouth 3 (three) times daily.     Please see After Visit Summary for patient specific instructions.  Future Appointments  Date Time Provider Department Center  06/30/2024 11:00 AM Marijean Charleston, PhD [facility] None  07/17/2024  3:00 PM Marijean Charleston, PhD [facility] None  08/05/2024  5:00 PM Marijean Charleston, PhD [facility] None  08/18/2024 11:00 AM Marijean Charleston, PhD CP[facility]  None  09/09/2024  5:00 PM Mitchum, Robert, PhD [facility] None    No orders of the  defined types were placed in this encounter.     -------------------------------

## 2024-06-24 ENCOUNTER — Other Ambulatory Visit (HOSPITAL_COMMUNITY): Payer: Self-pay

## 2024-06-24 MED ORDER — ESTRADIOL 0.01 % VA CREA
1.0000 | TOPICAL_CREAM | Freq: Two times a day (BID) | VAGINAL | 0 refills | Status: AC
  Filled 2024-06-24: qty 42.5, 90d supply, fill #0

## 2024-06-25 ENCOUNTER — Other Ambulatory Visit (HOSPITAL_COMMUNITY): Payer: Self-pay

## 2024-06-30 ENCOUNTER — Ambulatory Visit (INDEPENDENT_AMBULATORY_CARE_PROVIDER_SITE_OTHER): Admitting: Psychiatry

## 2024-06-30 DIAGNOSIS — F341 Dysthymic disorder: Secondary | ICD-10-CM

## 2024-06-30 DIAGNOSIS — Z639 Problem related to primary support group, unspecified: Secondary | ICD-10-CM

## 2024-06-30 DIAGNOSIS — F411 Generalized anxiety disorder: Secondary | ICD-10-CM

## 2024-06-30 NOTE — Progress Notes (Unsigned)
 Psychotherapy Progress Note Michelle Haynes, P.A. Michelle Kendall, PhD LP  Patient ID: Michelle Haynes)    MRN: [id number] Therapy format: Individual psychotherapy Date: 06/30/2024      Start: 11:18a     Stop: 12:16p     Time Spent: 58 min Location: Telehealth visit -- I connected with this patient by an approved telecommunication method (video), with her informed consent, and verifying identity and patient privacy.  I was located at my office and patient at her home.  As needed, we discussed the limitations, risks, and security and privacy concerns associated with telehealth service, including the availability and conditions which currently govern in-person appointments and the possibility that 3rd-party payment may not be fully guaranteed and she may be responsible for charges.  After she indicated understanding, we proceeded with the session.  Also discussed treatment planning, as needed, including ongoing verbal agreement with the plan, the opportunity to ask and answer all questions, her demonstrated understanding of instructions, and her readiness to call the office should symptoms worsen or she feels she is in a crisis state and needs more immediate and tangible assistance.   Session narrative (presenting needs, interim history, self-report of stressors and symptoms, applications of prior therapy, status changes, and interventions made in session) Regretting some this weekend, felt like she was too rough with a relatively new church friend -- answered a what brought you to this church question with how she saw they follow her orthodoxy about marriage, and it left a silence.  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  Noted that Haynes didn't call for her birthday, like she used to, just texted and sent a card and gift.  Then last night at small Haynes was talking about her son and worry over his salvation, when a young woman got hooked by part of her share to seemingly challenge her stand on traditional marriage and homosexuality (by hinting she believes everyone gets into heaven). Michelle Haynes found herself reflexively challenging for a moment but backed off, and made an early exit from the Haynes (which she had already planned, based on finding it to be slow-moving and prone to run over beyond her tolerance).    Discussed at length her layered reactions, which include feeling both that she had risked being harsh/overbearing and that she had shortchanged her principles by not confronting the woman more pointedly.  Confronted the wrong either way treatment she was giving herself, which would only contribute to more dissatisfaction and defensiveness, and in effect play into the devil's hands.  Lobbied to see that she actually witnessed well in catching herself and not driving her point, which would have even been changing the subject on herself because she only wanted to share a concern, not start a debate, herself.  Acknowledged she got hooked by unintentional social bait but got herself out of it, actually.  Supportively confronted the idea that she needed to stand up better for what she believed in, and certainly the irrational idea that she needed to start severing from Michelle Haynes as a whole because if this one, younger but longer-tenured, member's point of view was such, it must reflect the teaching of this church.  Difficult at first, but accepted that she was conclusion-jumping, and even hyping potential conflict to give herself an out from working on her ability to mix with others and her tolerance for possibly getting rejected.  Connected her uh-oh experience with Michelle Haynes  as context and pointed out she was letting her rejection sensitivity reactions run her thinking.    With Michelle Haynes, advocated to get herself ready to tell her -- instead of complaining about feeling forgotten -- she'd noticed what looks like distancing after she said something strident and wondered if Michelle Haynes was warming up to another long silence.  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  Offered a occupational hygienist with Michelle Haynes's spirit to get it that Michelle Haynes means no ill, means no competition, is grieved to hear it, and at her own best would like Michelle Haynes to know the freedom of not having to compete or compare for anything.  Helpful.  As we were hanging up, Michelle Haynes happened to call -- allowed to proceed.  Staff message next day that Michelle Haynes called to say thank you for talking me off the ledge.  Apparently good experience holding her tongue, keeping faith, letting Michelle Haynes reach out at her own ability, and hopefully similarly patient with her own mind about her church choice.  Therapeutic modalities: Cognitive Behavioral Therapy, Solution-Oriented/Positive Psychology, Environmental Manager, and Faith-sensitive  Mental Status/Observations:  Appearance:   Casual     Behavior:  Suspicious and Agitated with subject  Motor:  Normal  Speech/Language:   Negative  Affect:  Appropriate  Mood:  dysthymic  Thought process:  normal  Thought content:    Rumination  Sensory/Perceptual disturbances:    WNL  Orientation:  Fully oriented  Attention:  Good    Concentration:  Good  Memory:  WNL  Insight:    Fair  Judgment:   Good  Impulse Control:  Good   Risk Assessment: Danger to Self: No Self-injurious Behavior: No Danger to Others: No Physical Aggression / Violence: No Duty to  Warn: No Access to Firearms a concern: No  Assessment of progress:  situational setback(s)  Diagnosis:   ICD-10-CM   1. Generalized anxiety disorder  F41.1     2. Persistent depressive disorder  F34.1     3. Relationship problems  Z63.9      Plan:  Social involvement/roots -- Maintain church involvement, despite possibility of pulling up and moving.  Allow herself to join up with groups or ministries of choice and faithfully work out the capacity to mix with people who may disagree without throwing out the whole enterprise or overprotecting herself against the overperceived prospects of rejection or selling out.  Build out from healed friendship with Michelle Haynes in other ways as available. Food and weight mgmt -- Endorse continued GLP-1, as tolerated and affordable.  Continue looking for ways to do small commitments in exercise -- 10-minute video, for one.  Continue practicing restraint and healthful variety in food, esp using Weight Watchers tools she is familiar with.  Cont limit alcohol. 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 Social anxiety -- Practice trust in friends to be discreet, understanding, and available, and be ready to acknowledge complaints if any actually come up.  Manage anger to reduce likelihood of alienating.  As body image is a potent concern,  practice accepting good enough for now in any way possible.  Endorse situational use of beta blocker. Worry -- Practice trust in relatives to manage.  For intrusive thoughts, try asking if the problem that comes up is truly likely and truly unknown what to do if that concern comes up (i.e., answer the what if question).  Practice deciding when enough problem solving is enough also, and imagine how either her own actions, other forces, or God would address the problem in question. Mood management and self-esteem -- Journal ad lib, note any priorities to process and any thought  patterns that may tend to exacerbate distress or dysphoria, especially shaming.  Use slogan The beating drives the eating; no beating required.  Monitor for compulsive drinking.  Monitor for any need to process intense memories or regrets.   Sleep -- Observe limits on melatonin and don't expect it to force sleep; dose 3-10 mg at discretion 1-2 hrs before intended bedtime and practice sleep hygiene measures.  Investigate whether melatonin supplement may actually drive down her natural production.  Try out amber lenses as a more natural way.  Pay attention to conscious consent to sleep and truly settling regrets and worries. Spiritual concerns -- Let it be an open question whether God has ways she does not know for reaching those she gets concerned about.  Prioritize showing grace over preaching or imaginable coercion with loved ones, recognizing how he reacts, and what peace she brings to it, as nonverbal evangelism, actually. Family concerns -- Continue to show interest, voice support, refrain from surprise criticism with son Chauncey.  If confrontation needed, get an acceptance first to have a difficult conversation.  Continue to recruit trust with sister through nonreaction, warmth as she approaches, and willingness to help her break down assumptions (e.g., what she thought was silent treatment is taking pressure off and letting her decide, and what she thought may be irritation is actually missing her). Other recommendations/advice -- As may be noted above.  Continue to utilize previously learned skills ad lib. Medication compliance -- Maintain medication as prescribed and work faithfully with relevant prescriber(s) if any changes are desired or seem indicated. Crisis service -- Aware of call list and work-in appts.  Call the clinic on-call service, 988/hotline, 911, or present to [facility] or ER if any life-threatening psychiatric crisis. Followup -- Return for time as already scheduled.  Next scheduled visit  with me 07/17/2024.  Next scheduled in this office 07/17/2024.  Lamar Kendall, PhD Michelle Kendall, PhD LP Clinical Psychologist, [facility] Michelle Haynes, P.A. [address], [address] [place], [place] [address] [phone]

## 2024-07-07 ENCOUNTER — Other Ambulatory Visit (HOSPITAL_COMMUNITY): Payer: Self-pay

## 2024-07-14 ENCOUNTER — Other Ambulatory Visit (HOSPITAL_COMMUNITY): Payer: Self-pay

## 2024-07-14 DIAGNOSIS — L720 Epidermal cyst: Secondary | ICD-10-CM | POA: Diagnosis not present

## 2024-07-14 DIAGNOSIS — D485 Neoplasm of uncertain behavior of skin: Secondary | ICD-10-CM | POA: Diagnosis not present

## 2024-07-14 DIAGNOSIS — D2239 Melanocytic nevi of other parts of face: Secondary | ICD-10-CM | POA: Diagnosis not present

## 2024-07-14 DIAGNOSIS — L02212 Cutaneous abscess of back [any part, except buttock]: Secondary | ICD-10-CM | POA: Diagnosis not present

## 2024-07-14 MED ORDER — MINOCYCLINE HCL 100 MG PO CAPS
100.0000 mg | ORAL_CAPSULE | Freq: Two times a day (BID) | ORAL | 0 refills | Status: AC
  Filled 2024-07-14: qty 28, 14d supply, fill #0

## 2024-07-17 ENCOUNTER — Ambulatory Visit: Admitting: Psychiatry

## 2024-07-17 DIAGNOSIS — Z639 Problem related to primary support group, unspecified: Secondary | ICD-10-CM | POA: Diagnosis not present

## 2024-07-17 DIAGNOSIS — R69 Illness, unspecified: Secondary | ICD-10-CM

## 2024-07-17 DIAGNOSIS — F341 Dysthymic disorder: Secondary | ICD-10-CM | POA: Diagnosis not present

## 2024-07-17 DIAGNOSIS — F411 Generalized anxiety disorder: Secondary | ICD-10-CM

## 2024-07-17 DIAGNOSIS — F5089 Other specified eating disorder: Secondary | ICD-10-CM | POA: Diagnosis not present

## 2024-07-17 NOTE — Progress Notes (Signed)
 Psychotherapy Progress Note [facility] Michelle Kendall, PhD LP  Patient ID: Michelle Haynes)    MRN: [id number] Therapy format: Individual psychotherapy Date: 07/17/2024      Start: 3:15p     Stop: 4:03p     Time Spent: 48 min Location: Telehealth visit -- I connected with this patient by an approved telecommunication method (video), with her informed consent, and verifying identity and patient privacy.  I was located at my office and patient at her home.  As needed, we discussed the limitations, risks, and security and privacy concerns associated with telehealth service, including the availability and conditions which currently govern in-person appointments and the possibility that 3rd-party payment may not be fully guaranteed and she may be responsible for charges.  After she indicated understanding, we proceeded with the session.  Also discussed treatment planning, as needed, including ongoing verbal agreement with the plan, the opportunity to ask and answer all questions, her demonstrated understanding of instructions, and her readiness to call the office should symptoms worsen or she feels she is in a crisis state and needs more immediate and tangible assistance.   Session narrative (presenting needs, interim history, self-report of stressors and symptoms, applications of prior therapy, status changes, and interventions made in session) Matters resolved with [name], whom she recently saw for a wedding in [place].  Colorful story of [name] being stuck at the airport (due to the controller shortage) and getting hammered, meeting a bunch of other travelers.  Wedding itself was underplanned and required their combined help, not to mention colorful chaos with the bride, [name].  On antibiotic (minocycline ) for a boil on her back, with SE of vertigo.  Also had food poisoning the morning of her trip to [place] .  Required Imodium , Benefiber, and propranolol  to manage, not to  mention a few margaritas.  Proud to get through OK, although it also sensitized her to showing up among others with nerves and bodily reactions to.  Able to take a series of wins.      Having anxiety dreams, including a set of drastic, intrusive thoughts trying to make various problems work out. 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  Encouraged in recognizing the issue, doing what she reasonably can to find next renter, and managing expenses, e.g., by trimming alcohol costs.  Discussing her various sxs, interpreted intense sense of judgment and social anxiety as her core problem and narrated basics of learning to overcome it.  Highlighted the value of exposure, showing up for potentially anxiety-provoking experiences, and explanatory style when she does, emphasizing giving herself credit for effort without judging herself by some unrealistic standard of perfect resiliency or acceptance.  Reminded that when she is allowing anxiety to be present instead of preventing or avoiding it, she is practicing her courage and rewiring the initial anxiety.  Educated in the cingulate gyrus theory of OCD (and maybe other anxiety disorders), and how it sets her up to cast aspersions what may be wrong and jump to conclusions about personal failings, etc.  Encouraged to give herself room to accept that she has a hard to convince instinctive brain, so not settling anxiety as fast she wants to is not any kind of personal failing, just more practice to get accepting amounts of uncertainty.  Agrees, and recalls how her father was condemning of her.  Told her she looked like a whore one time, and other such utterances.  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  She tested negative, but for 10 years could have relapsing fear that she might have contracted it anyway.   Support/validation provided, and encouraged to remember that even that eventually faded as she dedicated to face it down without further testing.  Therapeutic modalities: Cognitive Behavioral Therapy, Solution-Oriented/Positive Psychology, Environmental Manager, and Faith-sensitive  Mental Status/Observations:  Appearance:   Casual     Behavior:  Appropriate  Motor:  Normal  Speech/Language:   Clear and Coherent  Affect:  Appropriate  Mood:  anxious  Thought process:  normal  Thought content:    Intrusive thoughts of fear and condemnation  Sensory/Perceptual disturbances:    WNL  Orientation:  Fully oriented  Attention:  Good    Concentration:  Good  Memory:  WNL  Insight:    Variable  Judgment:   Good  Impulse Control:  Good   Risk Assessment: Danger to Self: No Self-injurious Behavior: No Danger to Others: No Physical Aggression / Violence: No Duty to Warn: No Access to Firearms a concern: No  Assessment of progress:  progressing  Diagnosis:   ICD-10-CM   1. Generalized anxiety disorder  F41.1     2. Persistent depressive disorder  F34.1     3. Relationship problems  Z63.9     4. Compulsive overeating  F50.89     5. r/o PTSD (childhood abuse)  R69      Plan:  Social involvement/roots -- Maintain church involvement, despite possibility of pulling up and moving.  Allow herself to join up with groups or ministries of choice and faithfully work out the capacity to mix with people who may disagree without throwing out the whole enterprise or overprotecting herself against the overperceived prospects of rejection or selling out.  Build out from healed friendship with [name] in other ways as available. Food and weight mgmt -- Endorse continued GLP-1, as tolerated and affordable.  Continue looking for ways to do small commitments in exercise -- 10-minute video, for one.  Continue practicing restraint and healthful variety in food, esp using Weight Watchers tools she is familiar  with.  Cont limit alcohol. 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 Social anxiety -- Practice trust in friends to be discreet, understanding, and available, and be ready to acknowledge complaints if any actually come up.  Manage anger to reduce likelihood of alienating.  As body image is a potent concern, practice accepting good enough for now in any way possible.  Endorse situational use of beta blocker. Worry -- Practice trust in relatives to manage.  For intrusive thoughts, try asking if the problem that comes up is truly likely and truly unknown what to do if that concern comes up (i.e., answer the what if question).  Practice deciding when enough problem solving is enough also, and imagine how either her own actions, other forces, or God would address the problem in question. Mood management and self-esteem -- Journal ad lib, note any priorities to process and any thought patterns that may tend to exacerbate distress or dysphoria, especially shaming.  Use slogan The beating drives the eating; no beating required.  Monitor for compulsive drinking.  Monitor for any need to process intense memories or regrets.   Sleep -- Observe limits on melatonin and don't expect it to force sleep; dose 3-10 mg at discretion 1-2 hrs before intended bedtime and practice  sleep hygiene measures.  Investigate whether melatonin supplement may actually drive down her natural production.  Try out amber lenses as a more natural way.  Pay attention to conscious consent to sleep and truly settling regrets and worries. Spiritual concerns -- Let it be an open question whether God has ways she does not know for reaching those she gets concerned about.  Prioritize showing grace over preaching or imaginable coercion with loved ones, recognizing how he reacts, and what peace she brings to it, as nonverbal evangelism, actually. Family concerns -- Continue to show interest, voice  support, refrain from surprise criticism with son Chauncey.  If confrontation needed, get an acceptance first to have a difficult conversation.  Continue to recruit trust with sister through nonreaction, warmth as she approaches, and willingness to help her break down assumptions (e.g., what she thought was silent treatment is taking pressure off and letting her decide, and what she thought may be irritation is actually missing her). Other recommendations/advice -- As may be noted above.  Continue to utilize previously learned skills ad lib. Medication compliance -- Maintain medication as prescribed and work faithfully with relevant prescriber(s) if any changes are desired or seem indicated. Crisis service -- Aware of call list and work-in appts.  Call the clinic on-call service, 988/hotline, 911, or present to [facility] or ER if any life-threatening psychiatric crisis. Followup -- Return for time as already scheduled.  Next scheduled visit with me 08/05/2024.  Next scheduled in this office 08/05/2024.  Lamar Kendall, PhD Michelle Kendall, PhD LP Clinical Psychologist, [facility] [address] [place], [place] [address] [phone]

## 2024-07-20 ENCOUNTER — Other Ambulatory Visit (HOSPITAL_COMMUNITY): Payer: Self-pay

## 2024-07-20 MED ORDER — MUPIROCIN 2 % EX OINT
TOPICAL_OINTMENT | Freq: Two times a day (BID) | CUTANEOUS | 1 refills | Status: AC
  Filled 2024-07-20: qty 22, 30d supply, fill #0

## 2024-08-05 ENCOUNTER — Ambulatory Visit: Admitting: Psychiatry

## 2024-08-18 ENCOUNTER — Ambulatory Visit: Admitting: Psychiatry

## 2024-08-18 DIAGNOSIS — F341 Dysthymic disorder: Secondary | ICD-10-CM | POA: Diagnosis not present

## 2024-08-18 DIAGNOSIS — F5089 Other specified eating disorder: Secondary | ICD-10-CM | POA: Diagnosis not present

## 2024-08-18 DIAGNOSIS — F411 Generalized anxiety disorder: Secondary | ICD-10-CM

## 2024-08-18 NOTE — Progress Notes (Unsigned)
 Psychotherapy Progress Note [facility] Jodie Kendall, PhD LP  Patient ID: Michelle Haynes)    MRN: [id number] Therapy format: Individual psychotherapy Date: 08/18/2024      Start: 11:15a     Stop: ***:***     Time Spent: *** min Location: Telehealth visit -- I connected with this patient by an approved telecommunication method (video), with her informed consent, and verifying identity and patient privacy.  I was located at my office and patient at her home.  As needed, we discussed the limitations, risks, and security and privacy concerns associated with telehealth service, including the availability and conditions which currently govern in-person appointments and the possibility that 3rd-party payment may not be fully guaranteed and she may be responsible for charges.  After she indicated understanding, we proceeded with the session.  Also discussed treatment planning, as needed, including ongoing verbal agreement with the plan, the opportunity to ask and answer all questions, her demonstrated understanding of instructions, and her readiness to call the office should symptoms worsen or she feels she is in a crisis state and needs more immediate and tangible assistance.   Session narrative (presenting needs, interim history, self-report of stressors and symptoms, applications of prior therapy, status changes, and interventions made in session) Slow time with work, just still required to be on duty.  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  Michelle Haynes has wisely counseled her to lay down the what-iffing thoughts and just do a little studying.  Adviocated again for deciding she's done enough analyis and blame on herself for past mistakes, move as readily as possible to doing a little something applying the lessons she's gleaned so she can get results enough to call off her self-criticism.    Knows  her motivation is suffering from anxieties, too -- not having the next renter and seeing financial hardship coming, being turned down  DIL still not landed a job, and niece just landed one after 9 months out.    Has a friend Michelle Haynes who is interested in taking the PMP exam, too, tentative plan to study together.  Finding group chat for the project management   MCT oil is working some, but not as well.  Tames some cravings, but does find herself eating more chocolate and sweets at some times.  Does resolve to stick with the program until next physical and blood work in February.  Trusts it is helping delay carbs  Has found that MCT oil is acidic and somewhat irritates her throat, so  For other stress management, enjoying funny videos and Damien Search comedy (Unspeakable Things).    Therapeutic modalities: {AM:23362::Cognitive Behavioral Therapy,Solution-Oriented/Positive Psychology}  Mental Status/Observations:  Appearance:   {PSY:22683}     Behavior:  {PSY:21022743}  Motor:  {PSY:22302}  Speech/Language:   {PSY:22685}  Affect:  {PSY:22687}  Mood:  {PSY:31886}  Thought process:  {PSY:31888}  Thought content:    {[phone]}  Sensory/Perceptual disturbances:    {PSY:[id number]}  Orientation:  {Psych Orientation:23301::Fully oriented}  Attention:  {Good-Fair-Poor ratings:23770::Good}    Concentration:  {Good-Fair-Poor ratings:23770::Good}  Memory:  {[phone]}  Insight:    {Good-Fair-Poor ratings:23770::Good}  Judgment:   {Good-Fair-Poor ratings:23770::Good}  Impulse Control:  {Good-Fair-Poor ratings:23770::Good}   Risk Assessment: Danger to Self: {Risk:22599::No} Self-injurious Behavior: {Risk:22599::No} Danger to Others: {Risk:22599::No} Physical Aggression / Violence: {Risk:22599::No} Duty to Warn: {AMYesNo:22526::No} Access to Firearms a concern: {AMYesNo:22526::No}  Assessment of progress:  {Progress:22147::progressing}  Diagnosis:  ICD-10-CM   1. Generalized anxiety disorder  F41.1     2. Persistent depressive disorder  F34.1     3. Compulsive overeating  F50.89      Plan:  *** Other recommendations/advice -- As may be noted above.  Continue to utilize previously learned skills ad lib. Medication compliance -- Maintain medication as prescribed and work faithfully with relevant prescriber(s) if any changes are desired or seem indicated. Crisis service -- Aware of call list and work-in appts.  Call the clinic on-call service, 988/hotline, 911, or present to [facility] or ER if any life-threatening psychiatric crisis. Followup -- Return for time as already scheduled.  Next scheduled visit with me 09/09/2024.  Next scheduled in this office 09/09/2024.  Lamar Kendall, PhD Jodie Kendall, PhD LP Clinical Psychologist, [facility] [address], [address] [place], [place] [address] [phone]

## 2024-09-09 ENCOUNTER — Ambulatory Visit: Admitting: Psychiatry

## 2024-09-09 DIAGNOSIS — F5089 Other specified eating disorder: Secondary | ICD-10-CM | POA: Diagnosis not present

## 2024-09-09 DIAGNOSIS — F341 Dysthymic disorder: Secondary | ICD-10-CM

## 2024-09-09 DIAGNOSIS — F411 Generalized anxiety disorder: Secondary | ICD-10-CM

## 2024-09-09 NOTE — Progress Notes (Signed)
 Psychotherapy Progress Note [facility] Jodie Kendall, PhD LP  Patient ID: Michelle Haynes)    MRN: [id number] Therapy format: Individual psychotherapy Date: 09/09/2024      Start: 5:11p     Stop: 6:01p     Time Spent: 50 min Location: In-person   Session narrative (presenting needs, interim history, self-report of stressors and symptoms, applications of prior therapy, status changes, and interventions made in session) Doing sober January, just as voluntary challenge.  2 slips, recognizes bored + lonely as triggers.  Worked out today, 1st in a couple weeks.  Actually lifted weights at home yesterday, which should count.  Enjoyed Christmas in [place] with Michelle Haynes and sister and BIL coming down.  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  Not incensed about it.  A couple nibbles but no bite yet on renting her vacant room.  Struggled over a request to rent for 6 months, with a husband who is likely to be there all day, got herself to pass.  Now advertised on [employer].  While not renting, she normally runs $275/mo in the red, so delaying the PMP exam.    Re PMP, she is scheduling refresher study sessions for herself, not yet activating, even though work days are dull and not needing much from her right now.  Has not yet engaged her study buddy but still could.  Main issue is mesmerizing detail to learn, including 49 processes with multiple outputs.  Reiterated motivational tactics for tedious study, including scanning the glossary and checking things she doesn't recognize, rewriting things she's learned in colloquial language or graphics, figuring out paradoxical or nonsense ways the principles and mechanics work just to get a laugh (which can also take pressure off wanting to drink).    Therapeutic modalities: Cognitive Behavioral Therapy, Solution-Oriented/Positive Psychology, and Ego-Supportive  Mental  Status/Observations:  Appearance:   Casual     Behavior:  Appropriate  Motor:  Normal  Speech/Language:   Clear and Coherent  Affect:  Appropriate  Mood:  dysthymic and mildly  Thought process:  normal  Thought content:    WNL  Sensory/Perceptual disturbances:    WNL  Orientation:  Fully oriented  Attention:  Good    Concentration:  Good  Memory:  WNL  Insight:    Good  Judgment:   Good  Impulse Control:  Good   Risk Assessment: Danger to Self: No Self-injurious Behavior: No Danger to Others: No Physical Aggression / Violence: No Duty to Warn: No Access to Firearms a concern: No  Assessment of progress:  progressing  Diagnosis:   ICD-10-CM   1. Generalized anxiety disorder  F41.1     2. Persistent depressive disorder  F34.1     3. Compulsive overeating  F50.89      Plan:  Social involvement/roots -- Maintain church involvement, despite possibility of pulling up and moving.  Allow herself to join up with groups or ministries of choice and faithfully work out the capacity to mix with people who may disagree without throwing out the whole enterprise or overprotecting herself against the overperceived prospects of rejection or selling out.  Build out from healed friendship with Michelle Haynes in other ways as available. Food and weight mgmt -- Endorse continued GLP-1, as tolerated and affordable.  Continue looking for ways to do small commitments in exercise -- 10-minute video, for one.  Continue practicing restraint and healthful variety in food, esp using Weight Watchers tools she  is familiar with.  Cont limit alcohol. 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 Social anxiety -- Practice trust in friends to be discreet, understanding, and available, and be ready to acknowledge complaints if any actually come up.  Manage anger to reduce likelihood of alienating.  As body image is a potent concern, practice accepting good enough for  now in any way possible.  Endorse situational use of beta blocker. Worry -- Practice trust in relatives to manage.  For intrusive thoughts, try asking if the problem that comes up is truly likely and truly unknown what to do if that concern comes up (i.e., answer the what if question).  Practice deciding when enough problem solving is enough also, and imagine how either her own actions, other forces, or God would address the problem in question. Mood management and self-esteem -- Journal ad lib, note any priorities to process and any thought patterns that may tend to exacerbate distress or dysphoria, especially shaming.  Use slogan The beating drives the eating; no beating required.  Monitor for compulsive drinking.  Monitor for any need to process intense memories or regrets.   Sleep -- Observe limits on melatonin and don't expect it to force sleep; dose 3-10 mg at discretion 1-2 hrs before intended bedtime and practice sleep hygiene measures.  Investigate whether melatonin supplement may actually drive down her natural production.  Try out amber lenses as a more natural way.  Pay attention to conscious consent to sleep and truly settling regrets and worries. Spiritual concerns -- Let it be an open question whether God has ways she does not know for reaching those she gets concerned about.  Prioritize showing grace over preaching or imaginable coercion with loved ones, recognizing how he reacts, and what peace she brings to it, as nonverbal evangelism, actually. Family concerns -- Continue to show interest, voice support, refrain from surprise criticism with son Michelle Haynes.  If confrontation needed, get an acceptance first to have a difficult conversation.  Continue to recruit trust with sister through nonreaction, warmth as she approaches, and willingness to help her break down assumptions (e.g., what she thought was silent treatment is taking pressure off and letting her decide, and what she thought may be  irritation is actually missing her). Other recommendations/advice -- As may be noted above.  Continue to utilize previously learned skills ad lib. Medication compliance -- Maintain medication as prescribed and work faithfully with relevant prescriber(s) if any changes are desired or seem indicated. Crisis service -- Aware of call list and work-in appts.  Call the clinic on-call service, 988/hotline, 911, or present to [facility] or ER if any life-threatening psychiatric crisis. Followup -- Return for time as already scheduled.  Next scheduled visit with me 09/25/2024.  Next scheduled in this office 09/25/2024.  Lamar Kendall, PhD Jodie Kendall, PhD LP Clinical Psychologist, [facility] [address] [place], [place] [address] [phone]

## 2024-09-14 ENCOUNTER — Other Ambulatory Visit (HOSPITAL_COMMUNITY): Payer: Self-pay

## 2024-09-14 MED ORDER — ONDANSETRON 8 MG PO TBDP
8.0000 mg | ORAL_TABLET | ORAL | 2 refills | Status: AC
  Filled 2024-09-14: qty 12, 84d supply, fill #0

## 2024-09-16 ENCOUNTER — Other Ambulatory Visit (HOSPITAL_COMMUNITY): Payer: Self-pay

## 2024-09-17 ENCOUNTER — Other Ambulatory Visit (HOSPITAL_COMMUNITY): Payer: Self-pay

## 2024-09-22 ENCOUNTER — Telehealth: Admitting: Adult Health

## 2024-09-25 ENCOUNTER — Telehealth: Admitting: Psychiatry

## 2024-09-25 DIAGNOSIS — F411 Generalized anxiety disorder: Secondary | ICD-10-CM

## 2024-09-25 DIAGNOSIS — F341 Dysthymic disorder: Secondary | ICD-10-CM | POA: Diagnosis not present

## 2024-09-25 DIAGNOSIS — F5104 Psychophysiologic insomnia: Secondary | ICD-10-CM | POA: Diagnosis not present

## 2024-09-25 DIAGNOSIS — F5089 Other specified eating disorder: Secondary | ICD-10-CM | POA: Diagnosis not present

## 2024-09-25 NOTE — Progress Notes (Unsigned)
 Psychotherapy Progress Note [facility] Jodie Kendall, PhD LP  Patient ID: Michelle Haynes)    MRN: [id number] Therapy format: Individual psychotherapy Date: 09/25/2024      Start: 3:21p     Stop: 4:11p     Time Spent: 50 min Location: Telehealth visit -- I connected with this patient by an approved telecommunication method (video), with her informed consent, and verifying identity and patient privacy.  I was located at my office and patient at her home.  As needed, we discussed the limitations, risks, and security and privacy concerns associated with telehealth service, including the availability and conditions which currently govern in-person appointments and the possibility that 3rd-party payment may not be fully guaranteed and she may be responsible for charges.  After she indicated understanding, we proceeded with the session.  Also discussed treatment planning, as needed, including ongoing verbal agreement with the plan, the opportunity to ask and answer all questions, her demonstrated understanding of instructions, and her readiness to call the office should symptoms worsen or she feels she is in a crisis state and needs more immediate and tangible assistance.   Session narrative (presenting needs, interim history, self-report of stressors and symptoms, applications of prior therapy, status changes, and interventions made in session) Appt erroneously set for in-person, apparent slip in scheduling, but needed to call back and get changed to virtual.  Would really like for the office to set a rule to default to virtual on Tues/Fri; agreed to message but also urged to be sure to confirm any time she schedules.    Continued difficulty with insomnia.  Broke her dry January.  Using a sleep gummy with Ashwaganda, melatonin, L-theanine, but oit's not enough even with 10mg  melatonin to command sleep.  Advised stimulus control for wakeful times and willingness toget out of bed, brain  dump her thoughts.  And reduce melatonin to 3mg  or less, or change formulas.  Frustrated with herself for studying 1-2 days at a time but has had success using glossary, and condensing notes for the 49 processes and 5 domains and the outputs.  Reminded of paradoxical study -- coming up with funny and nonsense principles, e.g.  Re intrusive thoughts she's wasting her time, introduced the maneuver of taking her intrusive thought (or doom prophecy) and asking herself very sincerely how much she would bet it's actually true -- able   Goal next week to bear down, join the PMP association (PMI), for commitment and to find a study buddy.    Doing better about not beating herself up about perceived failures, overall.  Affirmed and encouraged in   Therapeutic modalities: {AM:23362::Cognitive Behavioral Therapy,Solution-Oriented/Positive Psychology}  Mental Status/Observations:  Appearance:   {PSY:22683}     Behavior:  {PSY:21022743}  Motor:  {PSY:22302}  Speech/Language:   {PSY:22685}  Affect:  {PSY:22687}  Mood:  {PSY:31886}  Thought process:  {PSY:31888}  Thought content:    {PSY:410 656 1974}  Sensory/Perceptual disturbances:    {PSY:908 547 1412}  Orientation:  {Psych Orientation:23301::Fully oriented}  Attention:  {Good-Fair-Poor ratings:23770::Good}    Concentration:  {Good-Fair-Poor ratings:23770::Good}  Memory:  {[phone]}  Insight:    {Good-Fair-Poor ratings:23770::Good}  Judgment:   {Good-Fair-Poor ratings:23770::Good}  Impulse Control:  {Good-Fair-Poor ratings:23770::Good}   Risk Assessment: Danger to Self: {Risk:22599::No} Self-injurious Behavior: {Risk:22599::No} Danger to Others: {Risk:22599::No} Physical Aggression / Violence: {Risk:22599::No} Duty to Warn: {AMYesNo:22526::No} Access to Firearms a concern: {AMYesNo:22526::No}  Assessment of progress:  {Progress:22147::progressing}  Diagnosis: No diagnosis found. Plan:  *** Other  recommendations/advice -- As may be noted  above.  Continue to utilize previously learned skills ad lib. Medication compliance -- Maintain medication as prescribed and work faithfully with relevant prescriber(s) if any changes are desired or seem indicated. Crisis service -- Aware of call list and work-in appts.  Call the clinic on-call service, 988/hotline, 911, or present to [facility] or ER if any life-threatening psychiatric crisis. Followup -- No follow-ups on file.  Next scheduled visit with me 10/09/2024.  Next scheduled in this office 09/29/2024.  Lamar Kendall, PhD Jodie Kendall, PhD LP Clinical Psychologist, [facility] [address] [place], [place] [address] [phone]

## 2024-09-29 ENCOUNTER — Other Ambulatory Visit: Payer: Self-pay

## 2024-09-29 ENCOUNTER — Encounter: Payer: Self-pay | Admitting: Adult Health

## 2024-09-29 ENCOUNTER — Telehealth: Admitting: Adult Health

## 2024-09-29 ENCOUNTER — Other Ambulatory Visit (HOSPITAL_COMMUNITY): Payer: Self-pay

## 2024-09-29 DIAGNOSIS — F411 Generalized anxiety disorder: Secondary | ICD-10-CM | POA: Diagnosis not present

## 2024-09-29 DIAGNOSIS — F418 Other specified anxiety disorders: Secondary | ICD-10-CM

## 2024-09-29 MED ORDER — ALPRAZOLAM 0.5 MG PO TABS
0.2500 mg | ORAL_TABLET | Freq: Every day | ORAL | 2 refills | Status: AC | PRN
  Filled 2024-09-29: qty 30, 30d supply, fill #0

## 2024-09-29 MED ORDER — PROPRANOLOL HCL 10 MG PO TABS
10.0000 mg | ORAL_TABLET | Freq: Three times a day (TID) | ORAL | 1 refills | Status: AC
  Filled 2024-09-29 (×2): qty 270, 90d supply, fill #0

## 2024-09-29 MED ORDER — PROPRANOLOL HCL 10 MG PO TABS
10.0000 mg | ORAL_TABLET | Freq: Three times a day (TID) | ORAL | 1 refills | Status: DC
  Filled 2024-09-29: qty 74, 24d supply, fill #0
  Filled 2024-09-29: qty 196, 66d supply, fill #0

## 2024-09-29 MED ORDER — PROPRANOLOL HCL 10 MG PO TABS: 10.0000 mg | ORAL_TABLET | Freq: Three times a day (TID) | ORAL | Status: DC

## 2024-09-29 NOTE — Progress Notes (Signed)
 Michelle Haynes [id number] 20-Jan-1961 64 y.o.  Virtual Visit via Video Note  I connected with pt @ on 09/29/24 at 10:30 AM EST by a video enabled telemedicine application and verified that I am speaking with the correct person using two identifiers.   I discussed the limitations of evaluation and management by telemedicine and the availability of in person appointments. The patient expressed understanding and agreed to proceed.  I discussed the assessment and treatment plan with the patient. The patient was provided an opportunity to ask questions and all were answered. The patient agreed with the plan and demonstrated an understanding of the instructions.   The patient was advised to call back or seek an in-person evaluation if the symptoms worsen or if the condition fails to improve as anticipated.  I provided 10 minutes of non-face-to-face time during this encounter.  The patient was located at home.  The provider was located at [facility].   Angeline LOISE Sayers, NP   Subjective:   Patient ID:  Michelle Haynes is a 64 y.o. (DOB [birth year]) female.  Chief Complaint: No chief complaint on file.   HPI [place] presents for follow-up of GAD.  Describes mood today as ok. Pleasant. Reports tearfulness at times. Mood symptoms - reports some anxiety, depression and irritability - no more so than normal. Reports varying interest and motivation. Denies recent panic attacks. Reports decreased worry, rumination and over thinking. Denies obsessive thoughts or acts. Reports mood is stable. Stating I feel like I'm doing alright. Feels like current medications are helpful. Reports taking current medications as prescribed.  Energy levels stable. Active, working on a regular exercise routine. Enjoys some usual interests and activities. Single. Lives alone. Has a 25 year old son. Spending time with family and friends. Appetite adequate. Reports weight loss - GLP-1. Reports sleep has  improved. Averages 7 to 7.5 hours. Focus and concentration stable. Completing tasks. Managing aspects of household. Works full-time SALES EXECUTIVE. Denies SI or HI.  Denies AH or VH. Denies self harm. Denies substance use.  Previous medication trials: History of antidepressants   Review of Systems:  Review of Systems  Musculoskeletal:  Negative for gait problem.  Neurological:  Negative for tremors.  Psychiatric/Behavioral:         Please refer to HPI    Medications: I have reviewed the patient's current medications.  Current Outpatient Medications  Medication Sig Dispense Refill   ALPRAZolam  (XANAX ) 0.5 MG tablet Take 0.5-1 tablets (0.25-0.5 mg total) by mouth daily as needed for anxiety. 20 tablet 2   Ascorbic Acid (VITAMIN C PO) Take 1 tablet by mouth daily.     Cholecalciferol (VITAMIN D ) 125 MCG (5000 UT) CAPS Take 10,000-15,000 Units by mouth daily.     clobetasol  ointment (TEMOVATE ) 0.05 % Apply 1 application (a thin layer) topically to affected areas 2 (two) times daily. 60 g 0   COLLAGEN PO Take 6 tablets by mouth daily.     COVID-19 mRNA vaccine 2024-2025 (COMIRNATY ) syringe Inject 0.3 mLs into the muscle. 0.3 mL 0   cyclobenzaprine  (FLEXERIL ) 10 MG tablet Take 0.5-1 tablets (5-10 mg total) by mouth every 8 (eight) hours as needed for spasms. 30 tablet 0   diphenhydrAMINE  (BENADRYL ) 25 MG tablet Take 50 mg by mouth at bedtime.     estradiol  (ESTRACE ) 0.01 % CREA vaginal cream Place a pea-sized amount to the external vagina 2 (two) times daily for 14 days. Then nightly 2-3 times a week. 42.5 g 0   hydroquinone  4 %  cream Apply as directed to affected area once daily on hands. 28.35 g 2   ibuprofen (ADVIL) 200 MG tablet Take 400 mg by mouth every 6 (six) hours as needed for moderate pain or headache.     loperamide  (IMODIUM  A-D) 2 MG tablet Take 2 mg by mouth 4 (four) times daily as needed for diarrhea or loose stools.     meloxicam  (MOBIC ) 15 MG tablet Take 1 tablet by mouth once a  day with food 30 tablet 1   mupirocin  ointment (BACTROBAN ) 2 % Apply a small amount to skin twice a day 22 g 1   ondansetron  (ZOFRAN -ODT) 8 MG disintegrating tablet Take 1 tablet (8 mg total) by mouth once a week. 12 tablet 2   propranolol  (INDERAL ) 10 MG tablet Take 1 tablet (10 mg total) by mouth 3 (three) times daily. 270 tablet 1   rosuvastatin  (CRESTOR ) 20 MG tablet Take 1 tablet (20 mg total) by mouth at bedtime for cholesterol 90 tablet 0   Semaglutide -Weight Management 0.25 MG/0.5ML SOAJ Inject 0.25 mg into the skin once a week. 2 mL 0   Semaglutide -Weight Management 0.25 MG/0.5ML SOAJ Inject into the skin once a week. 2 mL 0   Semaglutide -Weight Management 0.25 MG/0.5ML SOAJ Inject 0.25 mg into the skin once a week. 2 mL 0   triamcinolone  cream (KENALOG ) 0.1 % Apply 1 Application topically 2 (two) times daily. 30 g 0   No current facility-administered medications for this visit.    Medication Side Effects: None  Allergies: Allergies[1]  Past Medical History:  Diagnosis Date   High cholesterol    Hypertension     Family History  Problem Relation Age of Onset   Stroke Mother    Depression Mother    Diabetes Father    Anxiety disorder Father    BRCA 1/2 Neg Hx    Breast cancer Neg Hx     Social History   Socioeconomic History   Marital status: Single    Spouse name: Not on file   Number of children: 1   Years of education: Not on file   Highest education level: Not on file  Occupational History   Not on file  Tobacco Use   Smoking status: Never    Passive exposure: Never   Smokeless tobacco: Never  Vaping Use   Vaping status: Never Used  Substance and Sexual Activity   Alcohol use: Yes    Comment: occasional   Drug use: Never   Sexual activity: Not on file  Other Topics Concern   Not on file  Social History Narrative   Not on file   Social Drivers of Health   Tobacco Use: Low Risk (07/07/2024)   Received from [facility]   Patient  History    Smoking Tobacco Use: Never    Smokeless Tobacco Use: Never    Passive Exposure: Not on file  Financial Resource Strain: Not on file  Food Insecurity: Not on file  Transportation Needs: Not on file  Physical Activity: Sufficiently Active (12/31/2023)   Received from [facility]   Exercise Vital Sign    On average, how many days per week do you engage in moderate to strenuous exercise (like a brisk walk)?: 5 days    On average, how many minutes do you engage in exercise at this level?: 30 min  Stress: Not on file  Social Connections: Not on file  Intimate Partner Violence: Not on file  Depression (EYV7-0): Low  Risk (04/28/2024)   Depression (PHQ2-9)    PHQ-2 Score: 0  Alcohol Screen: Not on file  Housing: Not on file  Utilities: Not on file  Health Literacy: Not on file    Past Medical History, Surgical history, Social history, and Family history were reviewed and updated as appropriate.   Please see review of systems for further details on the patient's review from today.   Objective:   Physical Exam:  LMP 03/03/2018 Comment: U preg neg 03/15/16  Physical Exam Constitutional:      General: She is not in acute distress. Musculoskeletal:        General: No deformity.  Neurological:     Mental Status: She is alert and oriented to person, place, and time.     Coordination: Coordination normal.  Psychiatric:        Attention and Perception: Attention and perception normal. She does not perceive auditory or visual hallucinations.        Mood and Affect: Mood normal. Mood is not anxious or depressed. Affect is not labile, blunt, angry or inappropriate.        Speech: Speech normal.        Behavior: Behavior normal.        Thought Content: Thought content normal. Thought content is not paranoid or delusional. Thought content does not include homicidal or suicidal ideation. Thought content does not include homicidal or suicidal plan.        Cognition and  Memory: Cognition and memory normal.        Judgment: Judgment normal.     Comments: Insight intact     Lab Review:     Component Value Date/Time   NA 139 01/23/2022 1530   NA 141 04/04/2021 0851   K 4.3 01/23/2022 1530   CL 104 01/23/2022 1530   CO2 24 01/23/2022 1530   GLUCOSE 97 01/23/2022 1530   BUN 15 01/23/2022 1530   BUN 15 04/04/2021 0851   CREATININE 0.81 01/23/2022 1530   CALCIUM  10.4 (H) 01/23/2022 1530   PROT 6.9 04/04/2021 0851   ALBUMIN 4.8 04/04/2021 0851   AST 23 04/04/2021 0851   ALT 35 (H) 04/04/2021 0851   ALKPHOS 102 04/04/2021 0851   BILITOT 0.5 04/04/2021 0851   GFRNONAA >60 01/23/2022 1530   GFRAA 99.6 01/13/2021 0000       Component Value Date/Time   WBC 9.9 01/23/2022 1530   RBC 4.66 01/23/2022 1530   HGB 14.0 01/23/2022 1530   HCT 43.0 01/23/2022 1530   PLT 431 (H) 01/23/2022 1530   MCV 92.3 01/23/2022 1530   MCH 30.0 01/23/2022 1530   MCHC 32.6 01/23/2022 1530   RDW 12.9 01/23/2022 1530    No results found for: POCLITH, LITHIUM   No results found for: PHENYTOIN, PHENOBARB, VALPROATE, CBMZ   .res Assessment: Plan:    Treatment Plan/Recommendations:   Plan:  PDMP reviewed  Propranolol  10mg  TID prn anxiety  Using Xanax  occasionally for flight anxiety - will call when refill is needed.  RTC 3 months  10 minutes spent dedicated to the care of this patient on the date of this encounter to include pre-visit review of records, ordering of medication, post visit documentation, and face-to-face time with the patient discussing GAD. Discussed continuing current medication regimen.  Patient advised to contact office with any questions, adverse effects, or acute worsening in signs and symptoms.  Future Appointments  Date Time Provider Department Center  09/29/2024 10:30 AM Gage Treiber Nattalie, NP [facility] None  10/09/2024  2:00 PM Marijean Charleston, PhD [facility] None  10/20/2024 10:00 AM Marijean Charleston, PhD [facility] None  11/03/2024   3:00 PM Marijean Charleston, PhD [facility] None  11/18/2024  5:00 PM Marijean Charleston, PhD [facility] None  12/01/2024  3:00 PM Marijean Charleston, PhD [facility] None    No orders of the defined types were placed in this encounter.     -------------------------------      [1]  Allergies Allergen Reactions   Buspirone Nausea And Vomiting    from [facility]   Naltrexone-Bupropion Hcl Er Diarrhea    from [facility]   Squid Oil     squid (from [facility])

## 2024-10-09 ENCOUNTER — Ambulatory Visit: Admitting: Psychiatry

## 2024-10-09 DIAGNOSIS — F411 Generalized anxiety disorder: Secondary | ICD-10-CM

## 2024-10-09 DIAGNOSIS — F341 Dysthymic disorder: Secondary | ICD-10-CM

## 2024-10-09 NOTE — Progress Notes (Unsigned)
 Psychotherapy Progress Note [facility] Jodie Kendall, PhD LP  Patient ID: Michelle Haynes)    MRN: [id number] Therapy format: Individual psychotherapy Date: 10/09/2024      Start: 2:07p     Stop: ***:***     Time Spent: *** min Location: Telehealth visit -- I connected with this patient by an approved telecommunication method (video), with her informed consent, and verifying identity and patient privacy.  I was located at my office and patient at her office.  As needed, we discussed the limitations, risks, and security and privacy concerns associated with telehealth service, including the availability and conditions which currently govern in-person appointments and the possibility that 3rd-party payment may not be fully guaranteed and she may be responsible for charges.  After she indicated understanding, we proceeded with the session.  Also discussed treatment planning, as needed, including ongoing verbal agreement with the plan, the opportunity to ask and answer all questions, her demonstrated understanding of instructions, and her readiness to call the office should symptoms worsen or she feels she is in a crisis state and needs more immediate and tangible assistance.   Session narrative (presenting needs, interim history, self-report of stressors and symptoms, applications of prior therapy, status changes, and interventions made in session) Physically in office today, in the [employer] behind [employer].  Says she's had some frustrations today that provoke fantasies of beating a few people up.  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    Re medicine, propranolol  is working fine, Xanax  is sparingly used.  Travel anxiety, has to do with being able to find a bathroom if needed.  Going to [facility] Feb 22, looking forward to a [place] spa experience with friend  Michelle Haynes, who's been sick off and on, and still dealing with a hostile partner exit and lawsuit.    PMP study project continues to be off and on motivation, only a couple days at a time, and not retaining as much as she would hope.  Has resolved to seek a job closer to what she wants, and to be more natural in her self-presentation than she was in the interview she had.  Baseline, needs more money, too.  Has already applied to a couple positions with [employer] and [employer], and aware she has the option to relocate, too.  Attentive to the possibility -- lesson taken in hard experience -- that she wants to be sure she doesn't stray from [facility] plan for her.  Confirms her conception of God's plan is not so stringent it will hamstring her.    Got her renter Jan 16, working out well.    Probed whether she's applied the wanna bet? Strategy, not yet, really.  Focused on preparing for the spa trip and getting a bathing suit, which has made her confront loose skin from 60 lbs weight loss.  Shared about the [facility] and shared a video about women having sketches done based on their self-description and the description of bystanders.    Therapeutic modalities: {AM:23362::Cognitive Behavioral Therapy,Solution-Oriented/Positive Psychology}  Mental Status/Observations:  Appearance:   {PSY:22683}     Behavior:  {PSY:21022743}  Motor:  {PSY:22302}  Speech/Language:   {PSY:22685}  Affect:  {PSY:22687}  Mood:  {PSY:31886}  Thought process:  {PSY:31888}  Thought content:    {PSY:[id number]}  Sensory/Perceptual disturbances:    {PSY:[phone]}  Orientation:  {Psych  Orientation:23301::Fully oriented}  Attention:  {Good-Fair-Poor ratings:23770::Good}    Concentration:  {Good-Fair-Poor ratings:23770::Good}  Memory:  {PSY:802 329 3693}  Insight:    {Good-Fair-Poor ratings:23770::Good}  Judgment:   {Good-Fair-Poor ratings:23770::Good}  Impulse Control:  {Good-Fair-Poor  ratings:23770::Good}   Risk Assessment: Danger to Self: {Risk:22599::No} Self-injurious Behavior: {Risk:22599::No} Danger to Others: {Risk:22599::No} Physical Aggression / Violence: {Risk:22599::No} Duty to Warn: {AMYesNo:22526::No} Access to Firearms a concern: {AMYesNo:22526::No}  Assessment of progress:  {Progress:22147::progressing}  Diagnosis:   ICD-10-CM   1. Generalized anxiety disorder  F41.1     2. Persistent depressive disorder  F34.1      Plan:  Social involvement/roots -- Maintain church involvement, despite possibility of pulling up and moving.  Allow herself to join up with groups or ministries of choice and faithfully work out the capacity to mix with people who may disagree without throwing out the whole enterprise or overprotecting herself against the overperceived prospects of rejection or selling out.  Build out from healed friendship with Michelle Haynes in other ways as available. Food and weight mgmt -- Endorse continued GLP-1, as tolerated and affordable.  Continue looking for ways to do small commitments in exercise -- 10-minute video, for one.  Continue practicing restraint and healthful variety in food, esp using Weight Watchers tools she is familiar with.  Cont limit alcohol. 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 Social anxiety -- Practice trust in friends to be discreet, understanding, and available, and be ready to acknowledge complaints if any actually come up.  Manage anger to reduce likelihood of alienating.  As body image is a potent concern, practice accepting good enough for now in any way possible.  Endorse situational use of beta blocker. Worry -- Practice trust in relatives to manage.  For intrusive thoughts, try asking if the problem that comes up is truly likely and truly unknown what to do if that concern comes up (i.e., answer the what if question).  Practice deciding when enough problem  solving is enough also, and imagine how either her own actions, other forces, or God would address the problem in question. Mood management and self-esteem -- Journal ad lib, note any priorities to process and any thought patterns that may tend to exacerbate distress or dysphoria, especially shaming.  Use slogan The beating drives the eating; no beating required.  Monitor for compulsive drinking.  Monitor for any need to process intense memories or regrets.   Sleep -- Observe limits on melatonin and don't expect it to force sleep; dose 3-10 mg at discretion 1-2 hrs before intended bedtime and practice sleep hygiene measures.  Investigate whether melatonin supplement may actually drive down her natural production.  Try out amber lenses as a more natural way.  Pay attention to conscious consent to sleep and truly settling regrets and worries. Spiritual concerns -- Let it be an open question whether God has ways she does not know for reaching those she gets concerned about.  Prioritize showing grace over preaching or imaginable coercion with loved ones, recognizing how he reacts, and what peace she brings to it, as nonverbal evangelism, actually. Family concerns -- Continue to show interest, voice support, refrain from surprise criticism with son Michelle Haynes.  If confrontation needed, get an acceptance first to have a difficult conversation.  Continue to recruit trust with sister through nonreaction, warmth as she approaches, and willingness to help her break down assumptions (e.g., what she thought was silent treatment is taking pressure off and letting her decide, and what she thought  may be irritation is actually missing her). Other recommendations/advice -- As may be noted above.  Continue to utilize previously learned skills ad lib. Medication compliance -- Maintain medication as prescribed and work faithfully with relevant prescriber(s) if any changes are desired or seem indicated. Crisis service -- Aware of  call list and work-in appts.  Call the clinic on-call service, 988/hotline, 911, or present to [facility] or ER if any life-threatening psychiatric crisis. Followup -- Return for time as already scheduled.  Next scheduled visit with me 10/20/2024.  Next scheduled in this office 10/20/2024.  Lamar Kendall, PhD Jodie Kendall, PhD LP Clinical Psychologist, [facility] [address], [address] [place], [place] [address] [phone]

## 2024-10-20 ENCOUNTER — Ambulatory Visit: Admitting: Psychiatry

## 2024-11-03 ENCOUNTER — Ambulatory Visit: Admitting: Psychiatry

## 2024-11-18 ENCOUNTER — Ambulatory Visit: Admitting: Psychiatry

## 2024-12-01 ENCOUNTER — Ambulatory Visit: Admitting: Psychiatry

## 2024-12-22 ENCOUNTER — Telehealth: Admitting: Adult Health
